# Patient Record
Sex: Female | Born: 1990
Health system: Southern US, Community
[De-identification: ages and names within clinical notes are randomized; demographics above are authoritative.]

## PROBLEM LIST (undated history)

## (undated) DIAGNOSIS — R011 Cardiac murmur, unspecified: Secondary | ICD-10-CM

## (undated) DIAGNOSIS — D649 Anemia, unspecified: Secondary | ICD-10-CM

## (undated) DIAGNOSIS — F53 Postpartum depression: Secondary | ICD-10-CM

## (undated) DIAGNOSIS — F419 Anxiety disorder, unspecified: Secondary | ICD-10-CM

## (undated) DIAGNOSIS — K219 Gastro-esophageal reflux disease without esophagitis: Secondary | ICD-10-CM

## (undated) DIAGNOSIS — I10 Essential (primary) hypertension: Secondary | ICD-10-CM

## (undated) HISTORY — DX: Cardiac murmur, unspecified: R01.1

## (undated) HISTORY — PX: OTHER SURGICAL HISTORY: SHX169

## (undated) HISTORY — DX: Postpartum depression: F53.0

## (undated) HISTORY — DX: Anxiety disorder, unspecified: F41.9

## (undated) HISTORY — PX: WISDOM TOOTH EXTRACTION: SHX21

---

## 2008-08-23 ENCOUNTER — Inpatient Hospital Stay (HOSPITAL_COMMUNITY): Admission: AD | Admit: 2008-08-23 | Discharge: 2008-08-23 | Payer: Self-pay | Admitting: Obstetrics

## 2008-11-19 ENCOUNTER — Emergency Department (HOSPITAL_COMMUNITY): Admission: EM | Admit: 2008-11-19 | Discharge: 2008-11-19 | Payer: Self-pay | Admitting: Emergency Medicine

## 2011-06-14 LAB — URINE CULTURE: Colony Count: >=100000

## 2011-06-14 LAB — GC/CHLAMYDIA PROBE AMP, URINE: GC Probe Amp, Urine: NEGATIVE

## 2014-03-23 ENCOUNTER — Encounter: Payer: Self-pay | Admitting: Family

## 2014-03-23 ENCOUNTER — Encounter (INDEPENDENT_AMBULATORY_CARE_PROVIDER_SITE_OTHER): Payer: Self-pay

## 2014-03-23 ENCOUNTER — Ambulatory Visit (INDEPENDENT_AMBULATORY_CARE_PROVIDER_SITE_OTHER): Payer: 59 | Admitting: Family

## 2014-03-23 VITALS — BP 124/78 | HR 55 | Temp 99.2°F | Ht 66.0 in | Wt 235.0 lb

## 2014-03-23 DIAGNOSIS — Z0000 Encounter for general adult medical examination without abnormal findings: Secondary | ICD-10-CM

## 2014-03-23 NOTE — Progress Notes (Signed)
   Subjective:    Patient ID: Marissa Blake, female    DOB: 12/08/1990, 23 y.o.   MRN: [id number]  HPI Pt presents to office to establish care. Pt states she saw her gyn two weeks ago for pap and had blood work drawn. States they drew  "cholesterol, kidney, and diabetes".  Pt denies any pain, SOB, or edema.    Review of Systems  Constitutional: Negative.   HENT: Negative.   Eyes: Negative.   Respiratory: Negative.  Negative for shortness of breath.   Cardiovascular: Negative.  Negative for palpitations.  Gastrointestinal: Negative.   Endocrine: Negative.   Genitourinary: Negative.   Musculoskeletal: Positive for joint swelling.  Neurological: Negative.  Negative for headaches.  Hematological: Negative.   Psychiatric/Behavioral: Negative.   All other systems reviewed and are negative.      Objective:   Physical Exam  Vitals reviewed. Constitutional: She is oriented to person, place, and time. She appears well-developed and well-nourished. No distress.  HENT:  Head: Normocephalic and atraumatic.  Right Ear: External ear normal.  Mouth/Throat: Oropharynx is clear and moist.  Eyes: Pupils are equal, round, and reactive to light.  Neck: Normal range of motion. Neck supple. No thyromegaly present.  Cardiovascular: Normal rate, regular rhythm, normal heart sounds and intact distal pulses.   No murmur heard. Pulmonary/Chest: Effort normal and breath sounds normal. No respiratory distress. She has no wheezes.  Abdominal: Soft. Bowel sounds are normal. She exhibits no distension. There is no tenderness.  Musculoskeletal: Normal range of motion. She exhibits no edema and no tenderness.  Neurological: She is alert and oriented to person, place, and time. She has normal reflexes. No cranial nerve deficit.  Skin: Skin is warm and dry.  Psychiatric: She has a normal mood and affect. Her behavior is normal. Judgment and thought content normal.     BP 124/78  Pulse 55  Temp(Src) 99.2 F  (37.3 C) (Oral)  Ht 5\' 6"  (1.676 m)  Wt 235 lb (106.595 kg)  BMI 37.95 kg/m2      Assessment & Plan:  1. Physical exam   Continue all meds Pt had labs drawn at gyn office- Pt to bring us a copy of lab work Health Maintenance reviewed Diet and exercise encouraged RTO 1 year   Jannifer Rodneyhristy Hawks, FNP

## 2014-03-23 NOTE — Patient Instructions (Addendum)
Health Maintenance, Female A healthy lifestyle and preventative care can promote health and wellness.  Maintain regular health, dental, and eye exams.  Eat a healthy diet. Foods like vegetables, fruits, whole grains, low-fat dairy products, and lean protein foods contain the nutrients you need without too many calories. Decrease your intake of foods high in solid fats, added sugars, and salt. Get information about a proper diet from your caregiver, if necessary.  Regular physical exercise is one of the most important things you can do for your health. Most adults should get at least 150 minutes of moderate-intensity exercise (any activity that increases your heart rate and causes you to sweat) each week. In addition, most adults need muscle-strengthening exercises on 2 or more days a week.   Maintain a healthy weight. The body mass index (BMI) is a screening tool to identify possible weight problems. It provides an estimate of body fat based on height and weight. Your caregiver can help determine your BMI, and can help you achieve or maintain a healthy weight. For adults 20 years and older:  A BMI below 18.5 is considered underweight.  A BMI of 18.5 to 24.9 is normal.  A BMI of 25 to 29.9 is considered overweight.  A BMI of 30 and above is considered obese.  Maintain normal blood lipids and cholesterol by exercising and minimizing your intake of saturated fat. Eat a balanced diet with plenty of fruits and vegetables. Blood tests for lipids and cholesterol should begin at age 41 and be repeated every 5 years. If your lipid or cholesterol levels are high, you are over 50, or you are a high risk for heart disease, you may need your cholesterol levels checked more frequently.Ongoing high lipid and cholesterol levels should be treated with medicines if diet and exercise are not effective.  If you smoke, find out from your caregiver how to quit. If you do not use tobacco, do not start.  Lung  cancer screening is recommended for adults aged 66-80 years who are at high risk for developing lung cancer because of a history of smoking. Yearly low-dose computed tomography (CT) is recommended for people who have at least a 30-pack-year history of smoking and are a current smoker or have quit within the past 15 years. A pack year of smoking is smoking an average of 1 pack of cigarettes a day for 1 year (for example: 1 pack a day for 30 years or 2 packs a day for 15 years). Yearly screening should continue until the smoker has stopped smoking for at least 15 years. Yearly screening should also be stopped for people who develop a health problem that would prevent them from having lung cancer treatment.  If you are pregnant, do not drink alcohol. If you are breastfeeding, be very cautious about drinking alcohol. If you are not pregnant and choose to drink alcohol, do not exceed 1 drink per day. One drink is considered to be 12 ounces (355 mL) of beer, 5 ounces (148 mL) of wine, or 1.5 ounces (44 mL) of liquor.  Avoid use of street drugs. Do not share needles with anyone. Ask for help if you need support or instructions about stopping the use of drugs.  High blood pressure causes heart disease and increases the risk of stroke. Blood pressure should be checked at least every 1 to 2 years. Ongoing high blood pressure should be treated with medicines, if weight loss and exercise are not effective.  If you are 55 to 25  years old, ask your caregiver if you should take aspirin to prevent strokes.  Diabetes screening involves taking a blood sample to check your fasting blood sugar level. This should be done once every 3 years, after age 45, if you are within normal weight and without risk factors for diabetes. Testing should be considered at a younger age or be carried out more frequently if you are overweight and have at least 1 risk factor for diabetes.  Breast cancer screening is essential preventative care  for women. You should practice "breast self-awareness." This means understanding the normal appearance and feel of your breasts and may include breast self-examination. Any changes detected, no matter how small, should be reported to a caregiver. Women in their 20s and 30s should have a clinical breast exam (CBE) by a caregiver as part of a regular health exam every 1 to 3 years. After age 40, women should have a CBE every year. Starting at age 40, women should consider having a mammogram (breast X-ray) every year. Women who have a family history of breast cancer should talk to their caregiver about genetic screening. Women at a high risk of breast cancer should talk to their caregiver about having an MRI and a mammogram every year.  Breast cancer gene (BRCA)-related cancer risk assessment is recommended for women who have family members with BRCA-related cancers. BRCA-related cancers include breast, ovarian, tubal, and peritoneal cancers. Having family members with these cancers may be associated with an increased risk for harmful changes (mutations) in the breast cancer genes BRCA1 and BRCA2. Results of the assessment will determine the need for genetic counseling and BRCA1 and BRCA2 testing.  The Pap test is a screening test for cervical cancer. Women should have a Pap test starting at age 21. Between ages 21 and 29, Pap tests should be repeated every 2 years. Beginning at age 30, you should have a Pap test every 3 years as long as the past 3 Pap tests have been normal. If you had a hysterectomy for a problem that was not cancer or a condition that could lead to cancer, then you no longer need Pap tests. If you are between ages 65 and 70, and you have had normal Pap tests going back 10 years, you no longer need Pap tests. If you have had past treatment for cervical cancer or a condition that could lead to cancer, you need Pap tests and screening for cancer for at least 20 years after your treatment. If Pap  tests have been discontinued, risk factors (such as a new sexual partner) need to be reassessed to determine if screening should be resumed. Some women have medical problems that increase the chance of getting cervical cancer. In these cases, your caregiver may recommend more frequent screening and Pap tests.  The human papillomavirus (HPV) test is an additional test that may be used for cervical cancer screening. The HPV test looks for the virus that can cause the cell changes on the cervix. The cells collected during the Pap test can be tested for HPV. The HPV test could be used to screen women aged 30 years and older, and should be used in women of any age who have unclear Pap test results. After the age of 30, women should have HPV testing at the same frequency as a Pap test.  Colorectal cancer can be detected and often prevented. Most routine colorectal cancer screening begins at the age of 50 and continues through age 75. However, your caregiver may   recommend screening at an earlier age if you have risk factors for colon cancer. On a yearly basis, your caregiver may provide home test kits to check for hidden blood in the stool. Use of a small camera at the end of a tube, to directly examine the colon (sigmoidoscopy or colonoscopy), can detect the earliest forms of colorectal cancer. Talk to your caregiver about this at age 110, when routine screening begins. Direct examination of the colon should be repeated every 5 to 10 years through age 60, unless early forms of pre-cancerous polyps or small growths are found.  Hepatitis C blood testing is recommended for all people born from [birth year] through 1967 and any individual with known risks for hepatitis C.  Practice safe sex. Use condoms and avoid high-risk sexual practices to reduce the spread of sexually transmitted infections (STIs). Sexually active women aged 22 and younger should be checked for Chlamydia, which is a common sexually transmitted infection.  Older women with new or multiple partners should also be tested for Chlamydia. Testing for other STIs is recommended if you are sexually active and at increased risk.  Osteoporosis is a disease in which the bones lose minerals and strength with aging. This can result in serious bone fractures. The risk of osteoporosis can be identified using a bone density scan. Women ages 10 and over and women at risk for fractures or osteoporosis should discuss screening with their caregivers. Ask your caregiver whether you should be taking a calcium supplement or vitamin D to reduce the rate of osteoporosis.  Menopause can be associated with physical symptoms and risks. Hormone replacement therapy is available to decrease symptoms and risks. You should talk to your caregiver about whether hormone replacement therapy is right for you.  Use sunscreen. Apply sunscreen liberally and repeatedly throughout the day. You should seek shade when your shadow is shorter than you. Protect yourself by wearing long sleeves, pants, a wide-brimmed hat, and sunglasses year round, whenever you are outdoors.  Notify your caregiver of new moles or changes in moles, especially if there is a change in shape or color. Also notify your caregiver if a mole is larger than the size of a pencil eraser.  Stay current with your immunizations. Document Released: 03/11/2011 Document Revised: 12/21/2012 Document Reviewed: 07/28/2013 [facility] Patient Information 2015 [place], [place]. This information is not intended to replace advice given to you by your health care provider. Make sure you discuss any questions you have with your health care provider. Calorie Counting for Weight Loss Calories are energy you get from the things you eat and drink. Your body uses this energy to keep you going throughout the day. The number of calories you eat affects your weight. When you eat more calories than your body needs, your body stores the extra calories as  fat. When you eat fewer calories than your body needs, your body burns fat to get the energy it needs. Calorie counting means keeping track of how many calories you eat and drink each day. If you make sure to eat fewer calories than your body needs, you should lose weight. In order for calorie counting to work, you will need to eat the number of calories that are right for you in a day to lose a healthy amount of weight per week. A healthy amount of weight to lose per week is usually 1-2 lb (0.5-0.9 kg). A dietitian can determine how many calories you need in a day and give you suggestions on how to  reach your calorie goal.  WHAT IS MY MY PLAN? My goal is to have __________ calories per day.  If I have this many calories per day, I should lose around __________ pounds per week. WHAT DO I NEED TO KNOW ABOUT CALORIE COUNTING? In order to meet your daily calorie goal, you will need to:  Find out how many calories are in each food you would like to eat. Try to do this before you eat.  Decide how much of the food you can eat.  Write down what you ate and how many calories it had. Doing this is called keeping a food log. WHERE DO I FIND CALORIE INFORMATION? The number of calories in a food can be found on a Nutrition Facts label. Note that all the information on a label is based on a specific serving of the food. If a food does not have a Nutrition Facts label, try to look up the calories online or ask your dietitian for help. HOW DO I DECIDE HOW MUCH TO EAT? To decide how much of the food you can eat, you will need to consider both the number of calories in one serving and the size of one serving. This information can be found on the Nutrition Facts label. If a food does not have a Nutrition Facts label, look up the information online or ask your dietitian for help. Remember that calories are listed per serving. If you choose to have more than one serving of a food, you will have to multiply the calories  per serving by the amount of servings you plan to eat. For example, the label on a package of bread might say that a serving size is 1 slice and that there are 90 calories in a serving. If you eat 1 slice, you will have eaten 90 calories. If you eat 2 slices, you will have eaten 180 calories. HOW DO I KEEP A FOOD LOG? After each meal, record the following information in your food log:  What you ate.  How much of it you ate.  How many calories it had.  Then, add up your calories. Keep your food log near you, such as in a small notebook in your pocket. Another option is to use a mobile app or website. Some programs will calculate calories for you and show you how many calories you have left each time you add an item to the log. WHAT ARE SOME CALORIE COUNTING TIPS?  Use your calories on foods and drinks that will fill you up and not leave you hungry. Some examples of this include foods like nuts and nut butters, vegetables, lean proteins, and high-fiber foods (more than 5 g fiber per serving).  Eat nutritious foods and avoid empty calories. Empty calories are calories you get from foods or beverages that do not have many nutrients, such as candy and soda. It is better to have a nutritious high-calorie food (such as an avocado) than a food with few nutrients (such as a bag of chips).  Know how many calories are in the foods you eat most often. This way, you do not have to look up how many calories they have each time you eat them.  Look out for foods that may seem like low-calorie foods but are really high-calorie foods, such as baked goods, soda, and fat-free candy.  Pay attention to calories in drinks. Drinks such as sodas, specialty coffee drinks, alcohol, and juices have a lot of calories yet do not fill  you up. Choose low-calorie drinks like water and diet drinks.  Focus your calorie counting efforts on higher calorie items. Logging the calories in a garden salad that contains only  vegetables is less important than calculating the calories in a milk shake.  Find a way of tracking calories that works for you. Get creative. Most people who are successful find ways to keep track of how much they eat in a day, even if they do not count every calorie. WHAT ARE SOME PORTION CONTROL TIPS?  Know how many calories are in a serving. This will help you know how many servings of a certain food you can have.  Use a measuring cup to measure serving sizes. This is helpful when you start out. With time, you will be able to estimate serving sizes for some foods.  Take some time to put servings of different foods on your favorite plates, bowls, and cups so you know what a serving looks like.  Try not to eat straight from a bag or box. Doing this can lead to overeating. Put the amount you would like to eat in a cup or on a plate to make sure you are eating the right portion.  Use smaller plates, glasses, and bowls to prevent overeating. This is a quick and easy way to practice portion control. If your plate is smaller, less food can fit on it.  Try not to multitask while eating, such as watching TV or using your computer. If it is time to eat, sit down at a table and enjoy your food. Doing this will help you to start recognizing when you are full. It will also make you more aware of what and how much you are eating. HOW CAN I CALORIE COUNT WHEN EATING OUT?  Ask for smaller portion sizes or child-sized portions.  Consider sharing an [place] and sides instead of getting your own [place].  If you get your own [place]io Me, eat only half. Ask for a box at the beginning of your meal and put the rest of your entre in it so you are not tempted to eat it.  Look for the calories on the menu. If calories are listed, choose the lower calorie options.  Choose dishes that include vegetables, fruits, whole grains, low-fat dairy products, and lean protein. Focusing on smart food choices from each of the 5  food groups can help you stay on track at restaurants.  Choose items that are boiled, broiled, grilled, or steamed.  Choose water, milk, unsweetened iced tea, or other drinks without added sugars. If you want an alcoholic beverage, choose a lower calorie option. For example, a regular margarita can have up to 700 calories and a glass of wine has around 150.  Stay away from items that are buttered, battered, fried, or served with cream sauce. Items labeled "crispy" are usually fried, unless stated otherwise.  Ask for dressings, sauces, and syrups on the side. These are usually very high in calories, so do not eat much of them.  Watch out for salads. Many people think salads are a healthy option, but this is often not the case. Many salads come with bacon, fried chicken, lots of cheese, fried chips, and dressing. All of these items have a lot of calories. If you want a salad, choose a garden salad and ask for grilled meats or steak. Ask for the dressing on the side, or ask for olive oil and vinegar or lemon to use as dressing.  Estimate how many  servings of a food you are given. For example, a serving of cooked rice is 1/2 cup or about the size of half a tennis ball or one cupcake wrapper. Knowing serving sizes will help you be aware of how much food you are eating at restaurants. The list below tells you how big or small some common portion sizes are based on everyday objects.  1 oz--4 stacked dice.  3 oz--1 deck of cards.  1 tsp--1 dice.  1 Tbsp--1/2 a Ping-Pong ball.  2 Tbsp--1 Ping-Pong ball.  1/2 cup--1 tennis ball or 1 cupcake wrapper.  1 cup--1 baseball. Document Released: 08/26/2005 Document Revised: 08/31/2013 Document Reviewed: 07/01/2013 [facility] Patient Information 2015 [place], [place]. This information is not intended to replace advice given to you by your health care provider. Make sure you discuss any questions you have with your health care provider.

## 2014-12-23 ENCOUNTER — Ambulatory Visit (INDEPENDENT_AMBULATORY_CARE_PROVIDER_SITE_OTHER): Payer: 59 | Admitting: Physician Assistant

## 2014-12-23 ENCOUNTER — Encounter: Payer: Self-pay | Admitting: Physician Assistant

## 2014-12-23 VITALS — BP 125/90 | HR 56 | Temp 98.1°F | Ht 66.0 in | Wt 230.0 lb

## 2014-12-23 DIAGNOSIS — R1011 Right upper quadrant pain: Secondary | ICD-10-CM

## 2014-12-23 DIAGNOSIS — Z8379 Family history of other diseases of the digestive system: Secondary | ICD-10-CM | POA: Diagnosis not present

## 2014-12-23 NOTE — Progress Notes (Signed)
   Subjective:    Patient ID: Maurilio LovelyAmber L Shaw, female    DOB: 10/17/1990, 24 y.o.   MRN: [id number]  HPI 24 y/o female presents w/ c/o intermittent nausea, worse with greasy, spicy foods, pain in RUQ radiating to shoulder x 5 years which has worsened and increased in frequency. Last episode was 2 weeks ago. Strong family hx of gallbladder problems.     Review of Systems  Constitutional: Negative.   Gastrointestinal: Positive for nausea (intermittent), abdominal pain (right side radiating to right shoulder ), diarrhea and constipation.  Genitourinary: Negative.   All other systems reviewed and are negative.      Objective:   Physical Exam  Constitutional: She is oriented to person, place, and time. She appears well-developed. No distress.  Obese   Abdominal: Soft. Bowel sounds are normal. She exhibits no distension and no mass. There is tenderness (mild in RUQ, negative murphys sign ). There is no rebound and no guarding.  Negative CVA TTP  Neurological: She is alert and oriented to person, place, and time. She exhibits abnormal muscle tone.  Skin: She is not diaphoretic.  Psychiatric: She has a normal mood and affect. Her behavior is normal. Judgment and thought content normal.  Nursing note and vitals reviewed.         Assessment & Plan:  1. Colicky RUQ abdominal pain 2. Family history of gallbladder disease  - Ambulatory referral to Gastroenterology

## 2015-01-03 ENCOUNTER — Encounter (INDEPENDENT_AMBULATORY_CARE_PROVIDER_SITE_OTHER): Payer: Self-pay | Admitting: *Deleted

## 2015-01-31 ENCOUNTER — Encounter (INDEPENDENT_AMBULATORY_CARE_PROVIDER_SITE_OTHER): Payer: Self-pay | Admitting: *Deleted

## 2015-01-31 ENCOUNTER — Encounter (INDEPENDENT_AMBULATORY_CARE_PROVIDER_SITE_OTHER): Payer: Self-pay | Admitting: Internal Medicine

## 2015-01-31 ENCOUNTER — Ambulatory Visit (INDEPENDENT_AMBULATORY_CARE_PROVIDER_SITE_OTHER): Payer: 59 | Admitting: Internal Medicine

## 2015-01-31 VITALS — BP 108/80 | HR 72 | Temp 98.2°F | Ht 66.0 in | Wt 233.2 lb

## 2015-01-31 DIAGNOSIS — R1011 Right upper quadrant pain: Secondary | ICD-10-CM

## 2015-01-31 LAB — HEPATIC FUNCTION PANEL
ALBUMIN: 3.6 g/dL (ref 3.5–5.2)
ALK PHOS: 75 U/L (ref 39–117)
ALT: 10 U/L (ref 0–35)
AST: 12 U/L (ref 0–37)
BILIRUBIN DIRECT: 0.1 mg/dL (ref 0.0–0.3)
BILIRUBIN INDIRECT: 0.2 mg/dL (ref 0.2–1.2)
TOTAL PROTEIN: 7.4 g/dL (ref 6.0–8.3)
Total Bilirubin: 0.3 mg/dL (ref 0.2–1.2)

## 2015-01-31 NOTE — Patient Instructions (Signed)
US abdomen and Hepatic function. If normal will get HIDA scan

## 2015-01-31 NOTE — Progress Notes (Signed)
   Subjective:    Patient ID: Marissa LovelyAmber L Shaw, female    DOB: 02/24/1991, 24 y.o.   MRN: [id number]  HPI [facility] RUQ. She tells me the pain is not constant. Usually occurs at night or after she eats.  Sometimes it goes to her rt shoulder and upper left quadrant. Symptoms for a couple of years.  Symptoms worse in the past year. Aggravated by greasy foods. She is trying to avoid them. Appetite is good. No weight loss. She usually has a BM  X 1 a day or every other day.,  Family hx of GB disease in her family. (Sister of this year removed)    Review of Systems Past Medical History  Diagnosis Date  . Heart murmur     Past Surgical History  Procedure Laterality Date  . None      Allergies  Allergen Reactions  . Benzoyl Peroxide Other (See Comments)    redness  . Latex Other (See Comments)    Rash/irritation    Current Outpatient Prescriptions on File Prior to Visit  Medication Sig Dispense Refill  . JUNEL 1/20 1-20 MG-MCG tablet      No current facility-administered medications on file prior to visit.          Single, No children. Works at [employer]. Student at [employer]: Cos[employer]ology Objective:   Physical Exam Blood pressure 108/80, pulse 72, temperature 98.2 F (36.8 C), height 5\' 6"  (1.676 m), weight 233 lb 3.2 oz (105.779 kg).   Alert and oriented. Skin warm and dry. Oral mucosa is moist.   . Sclera anicteric, conjunctivae is pink. Thyroid not enlarged. No cervical lymphadenopathy. Lungs clear. Heart regular rate and rhythm.  Abdomen is soft. Bowel sounds are positive. No hepatomegaly. No abdominal masses felt. No tenderness.  No edema to lower extremities.       Assessment & Plan:  RUQ pain. GB disease needs to be ruled out. Hepatic function. US abdomen. If normal. HIDA scan.

## 2015-02-01 ENCOUNTER — Telehealth (INDEPENDENT_AMBULATORY_CARE_PROVIDER_SITE_OTHER): Payer: Self-pay | Admitting: Internal Medicine

## 2015-02-01 NOTE — Telephone Encounter (Signed)
Results given to patient. She is scheduled for an US abd. Tomorrow.

## 2015-02-02 ENCOUNTER — Ambulatory Visit (HOSPITAL_COMMUNITY)
Admission: RE | Admit: 2015-02-02 | Discharge: 2015-02-02 | Disposition: A | Discharge status: Home / Self Care | Payer: 59 | Source: Ambulatory Visit | Attending: Internal Medicine | Admitting: Internal Medicine

## 2015-02-02 DIAGNOSIS — R1011 Right upper quadrant pain: Secondary | ICD-10-CM | POA: Diagnosis present

## 2015-02-03 ENCOUNTER — Telehealth (INDEPENDENT_AMBULATORY_CARE_PROVIDER_SITE_OTHER): Payer: Self-pay | Admitting: Internal Medicine

## 2015-02-03 DIAGNOSIS — R1011 Right upper quadrant pain: Secondary | ICD-10-CM

## 2015-02-03 NOTE — Telephone Encounter (Signed)
Continues to have pain ruq after eating. Hepatic function is normal. Am going to refer her to Dr. Lovell SheehanJenkins to see if she needs a cholecystectomy

## 2015-03-02 ENCOUNTER — Encounter (HOSPITAL_COMMUNITY): Payer: Self-pay

## 2015-03-02 ENCOUNTER — Emergency Department (HOSPITAL_COMMUNITY): Payer: Worker's Compensation

## 2015-03-02 ENCOUNTER — Emergency Department (HOSPITAL_COMMUNITY)
Admission: EM | Admit: 2015-03-02 | Discharge: 2015-03-03 | Disposition: A | Discharge status: Home / Self Care | Payer: Worker's Compensation | Attending: Emergency Medicine | Admitting: Emergency Medicine

## 2015-03-02 DIAGNOSIS — X58XXXA Exposure to other specified factors, initial encounter: Secondary | ICD-10-CM | POA: Insufficient documentation

## 2015-03-02 DIAGNOSIS — S8392XA Sprain of unspecified site of left knee, initial encounter: Secondary | ICD-10-CM | POA: Diagnosis not present

## 2015-03-02 DIAGNOSIS — S8992XA Unspecified injury of left lower leg, initial encounter: Secondary | ICD-10-CM | POA: Diagnosis present

## 2015-03-02 DIAGNOSIS — Z91040 Latex allergy status: Secondary | ICD-10-CM | POA: Diagnosis not present

## 2015-03-02 DIAGNOSIS — Y9389 Activity, other specified: Secondary | ICD-10-CM | POA: Insufficient documentation

## 2015-03-02 DIAGNOSIS — R011 Cardiac murmur, unspecified: Secondary | ICD-10-CM | POA: Insufficient documentation

## 2015-03-02 DIAGNOSIS — Y929 Unspecified place or not applicable: Secondary | ICD-10-CM | POA: Diagnosis not present

## 2015-03-02 DIAGNOSIS — Y998 Other external cause status: Secondary | ICD-10-CM | POA: Insufficient documentation

## 2015-03-02 NOTE — ED Notes (Signed)
Pt states she was working at [employer] when she feels she twisted her left knee and felt a pop at that time. Pt states since then it has been more painful.  Pt ambulatory without diff.

## 2015-03-03 ENCOUNTER — Other Ambulatory Visit: Payer: Self-pay | Admitting: Surgery

## 2015-03-03 ENCOUNTER — Ambulatory Visit: Payer: 59 | Admitting: Surgery

## 2015-03-03 DIAGNOSIS — R1011 Right upper quadrant pain: Secondary | ICD-10-CM

## 2015-03-03 DIAGNOSIS — R109 Unspecified abdominal pain: Secondary | ICD-10-CM

## 2015-03-03 DIAGNOSIS — S8392XA Sprain of unspecified site of left knee, initial encounter: Secondary | ICD-10-CM | POA: Diagnosis not present

## 2015-03-03 MED ORDER — IBUPROFEN 600 MG PO TABS: 600.0000 mg | ORAL_TABLET | Freq: Four times a day (QID) | ORAL | Status: DC

## 2015-03-03 MED ORDER — IBUPROFEN 800 MG PO TABS
800.0000 mg | ORAL_TABLET | Freq: Once | ORAL | Status: AC
  Administered 2015-03-03: 800 mg via ORAL
  Filled 2015-03-03: qty 1

## 2015-03-03 NOTE — Discharge Instructions (Signed)
Your x-ray is negative for fracture or dislocation or effusion. Please use the knee immobilizer for the next 7 days. You do not have to sleep in this device. Please use ibuprofen with breakfast, lunch, dinner, and at bedtime. Please see Dr. Romeo Apple for additional evaluation if not improving. Knee Sprain A knee sprain is a tear in one of the strong, fibrous tissues that connect the bones (ligaments) in your knee. The severity of the sprain depends on how much of the ligament is torn. The tear can be either partial or complete. CAUSES  Often, sprains are a result of a fall or injury. The force of the impact causes the fibers of your ligament to stretch too much. This excess tension causes the fibers of your ligament to tear. SIGNS AND SYMPTOMS  You may have some loss of motion in your knee. Other symptoms include:  Bruising.  Pain in the knee area.  Tenderness of the knee to the touch.  Swelling. DIAGNOSIS  To diagnose a knee sprain, your health care provider will physically examine your knee. Your health care provider may also suggest an X-ray exam of your knee to make sure no bones are broken. TREATMENT  If your ligament is only partially torn, treatment usually involves keeping the knee in a fixed position (immobilization) or bracing your knee for activities that require movement for several weeks. To do this, your health care provider will apply a bandage, cast, or splint to keep your knee from moving and to support your knee during movement until it heals. For a partially torn ligament, the healing process usually takes 4-6 weeks. If your ligament is completely torn, depending on which ligament it is, you may need surgery to reconnect the ligament to the bone or reconstruct it. After surgery, a cast or splint may be applied and will need to stay on your knee for 4-6 weeks while your ligament heals. HOME CARE INSTRUCTIONS  Keep your injured knee elevated to decrease swelling.  To ease pain  and swelling, apply ice to the injured area:  Put ice in a plastic bag.  Place a towel between your skin and the bag.  Leave the ice on for 20 minutes, 2-3 times a day.  Only take medicine for pain as directed by your health care provider.  Do not leave your knee unprotected until pain and stiffness go away (usually 4-6 weeks).  If you have a cast or splint, do not allow it to get wet. If you have been instructed not to remove it, cover it with a plastic bag when you shower or bathe. Do not swim.  Your health care provider may suggest exercises for you to do during your recovery to prevent or limit permanent weakness and stiffness. SEEK IMMEDIATE MEDICAL CARE IF:  Your cast or splint becomes damaged.  Your pain becomes worse.  You have significant pain, swelling, or numbness below the cast or splint. MAKE SURE YOU:  Understand these instructions.  Will watch your condition.  Will get help right away if you are not doing well or get worse. Document Released: 08/26/2005 Document Revised: 06/16/2013 Document Reviewed: 04/07/2013 [facility] Patient Information 2015 [place], [place]. This information is not intended to replace advice given to you by your health care provider. Make sure you discuss any questions you have with your health care provider.

## 2015-03-03 NOTE — ED Provider Notes (Signed)
CSN: [id number]     Arrival date & time 03/02/15  2307 History   First MD Initiated Contact with Patient 03/02/15 2343     Chief Complaint  Patient presents with  . Knee Injury     (Consider location/radiation/quality/duration/timing/severity/associated sxs/prior Treatment) HPI Comments: Patient states that on Tuesday, June 21 she was working with a heavy box, and going up some steps when she twisted her left knee and felt a pop. She had some pain at the time was able to work through it. On Wednesday she states the pain was really not that bad, but on Thursday, June 23, the patient states that she had more popping and a burning sensation in the knee. She did not have a fall, but the kneecap, becoming more and more painful. She's not had any previous operations or procedures involving the knee. No previous injury reported.  The history is provided by the patient.    Past Medical History  Diagnosis Date  . Heart murmur    Past Surgical History  Procedure Laterality Date  . None     Family History  Problem Relation Age of Onset  . Autism Sister   . Hypertension Father    History  Substance Use Topics  . Smoking status: Never Smoker   . Smokeless tobacco: Not on file  . Alcohol Use: Yes     Comment: social   OB History    No data available     Review of Systems  Constitutional: Negative for activity change.       All ROS Neg except as noted in HPI  HENT: Negative for nosebleeds.   Eyes: Negative for photophobia and discharge.  Respiratory: Negative for cough, shortness of breath and wheezing.   Cardiovascular: Negative for chest pain and palpitations.  Gastrointestinal: Negative for abdominal pain and blood in stool.  Genitourinary: Negative for dysuria, frequency and hematuria.  Musculoskeletal: Negative for back pain, arthralgias and neck pain.  Skin: Negative.   Neurological: Negative for dizziness, seizures and speech difficulty.  Psychiatric/Behavioral: Negative  for hallucinations and confusion.      Allergies  Benzoyl peroxide and Latex  Home Medications   Prior to Admission medications   Medication Sig Start Date End Date Taking? Authorizing Provider  JUNEL 1/20 1-20 MG-MCG tablet  03/10/14  Yes Historical Provider, MD   BP 129/90 mmHg  Pulse 89  Temp(Src) 99.4 F (37.4 C) (Oral)  Resp 16  Ht 5\' 6"  (1.676 m)  Wt 232 lb (105.235 kg)  BMI 37.46 kg/m2  SpO2 99%  LMP 01/31/2015 Physical Exam  Constitutional: She is oriented to person, place, and time. She appears well-developed and well-nourished.  Non-toxic appearance.  HENT:  Head: Normocephalic.  Right Ear: Tympanic membrane and external ear normal.  Left Ear: Tympanic membrane and external ear normal.  Eyes: EOM and lids are normal. Pupils are equal, round, and reactive to light.  Neck: Normal range of motion. Neck supple. Carotid bruit is not present.  Cardiovascular: Normal rate, regular rhythm, normal heart sounds, intact distal pulses and normal pulses.   Pulmonary/Chest: Breath sounds normal. No respiratory distress.  Abdominal: Soft. Bowel sounds are normal. There is no tenderness. There is no guarding.  Musculoskeletal: Normal range of motion.  There is full range of motion of the right hip and knee. There is pain with thumb extension and somewhat flexion. There is no effusion of the joint. There is no posterior mass appreciated. Is no deformity of the tibia area. This will  range of motion of the right ankle.  Lymphadenopathy:       Head (right side): No submandibular adenopathy present.       Head (left side): No submandibular adenopathy present.    She has no cervical adenopathy.  Neurological: She is alert and oriented to person, place, and time. She has normal strength. No cranial nerve deficit or sensory deficit.  No motor or sensory deficits noted of the lower extremities.  Skin: Skin is warm and dry.  Psychiatric: She has a normal mood and affect. Her speech is  normal.  Nursing note and vitals reviewed.   ED Course  Procedures (including critical care time) Labs Review Labs Reviewed - No data to display  Imaging Review Dg Knee Complete 4 Views Left  03/03/2015   CLINICAL DATA:  Patient fell and twisted her left knee.  EXAM: LEFT KNEE - COMPLETE 4+ VIEW  COMPARISON:  None.  FINDINGS: There is no evidence of fracture, dislocation, or joint effusion. There is no evidence of arthropathy or other focal bone abnormality. Soft tissues are unremarkable.  IMPRESSION: Negative.   Electronically Signed   By: Annia Belt M.D.   On: 03/03/2015 00:19     EKG Interpretation None      MDM  Vital signs reviewed. X-ray of the left knee is negative for fracture, dislocation, or joint effusion. The examination favors a sprain of the left knee. The patient will be fitted with a knee immobilizer. Crutches were offered, but refused at this time. Patient will use ibuprofen 600 mg 4 times a day. Patient is to see Dr. Romeo Apple for additional orthopedic evaluation if not improving. Patient is in agreement with this discharge plan.    Final diagnoses:  None    *I have reviewed nursing notes, vital signs, and all appropriate lab and imaging results for this patient.[address] PA-C 03/03/15 0050  Azalia Bilis, MD 03/03/15 [phone]

## 2015-03-14 ENCOUNTER — Ambulatory Visit (HOSPITAL_COMMUNITY)
Admission: RE | Admit: 2015-03-14 | Discharge: 2015-03-14 | Disposition: A | Discharge status: Home / Self Care | Payer: 59 | Source: Ambulatory Visit | Attending: Surgery | Admitting: Surgery

## 2015-03-14 DIAGNOSIS — R1011 Right upper quadrant pain: Secondary | ICD-10-CM | POA: Diagnosis not present

## 2015-03-14 MED ORDER — SINCALIDE 5 MCG IJ SOLR
INTRAMUSCULAR | Status: AC
  Administered 2015-03-14: 2.11 ug
  Filled 2015-03-14: qty 10

## 2015-03-14 MED ORDER — TECHNETIUM TC 99M MEBROFENIN IV KIT
5.0000 | PACK | Freq: Once | INTRAVENOUS | Status: AC | PRN
  Administered 2015-03-14: 5 via INTRAVENOUS

## 2015-03-14 MED ORDER — STERILE WATER FOR INJECTION IJ SOLN
INTRAMUSCULAR | Status: AC
  Administered 2015-03-14: 10 mL
  Filled 2015-03-14: qty 20

## 2016-06-19 IMAGING — DX DG KNEE COMPLETE 4+V*L*
4 series · 4 of 4 positions shown · non-contrast
Comparison: None.

CLINICAL DATA: Patient fell and twisted her left knee.

EXAM:
LEFT KNEE - COMPLETE 4+ VIEW

[knee ap]
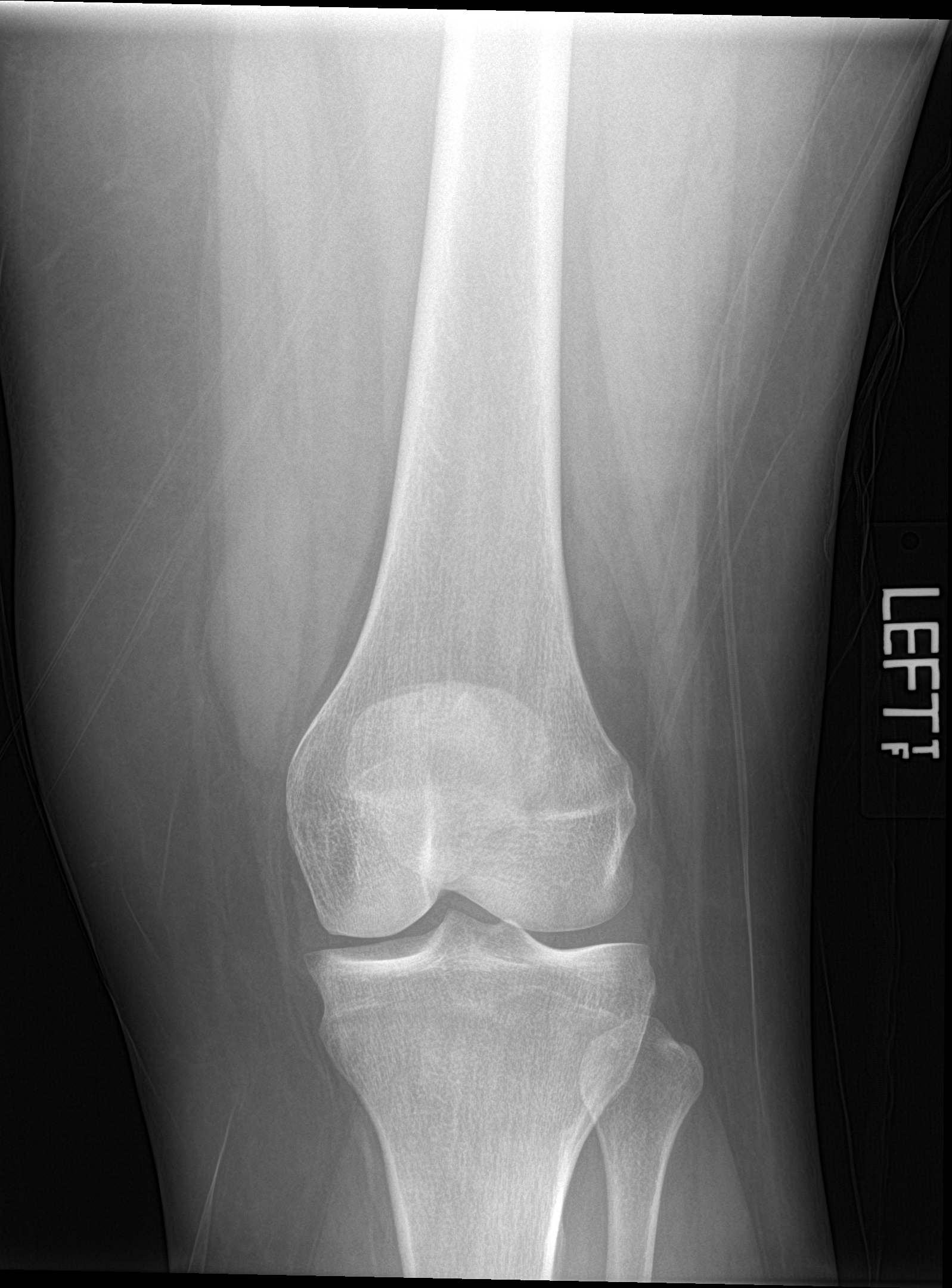

[knee obl (1 of 2)]
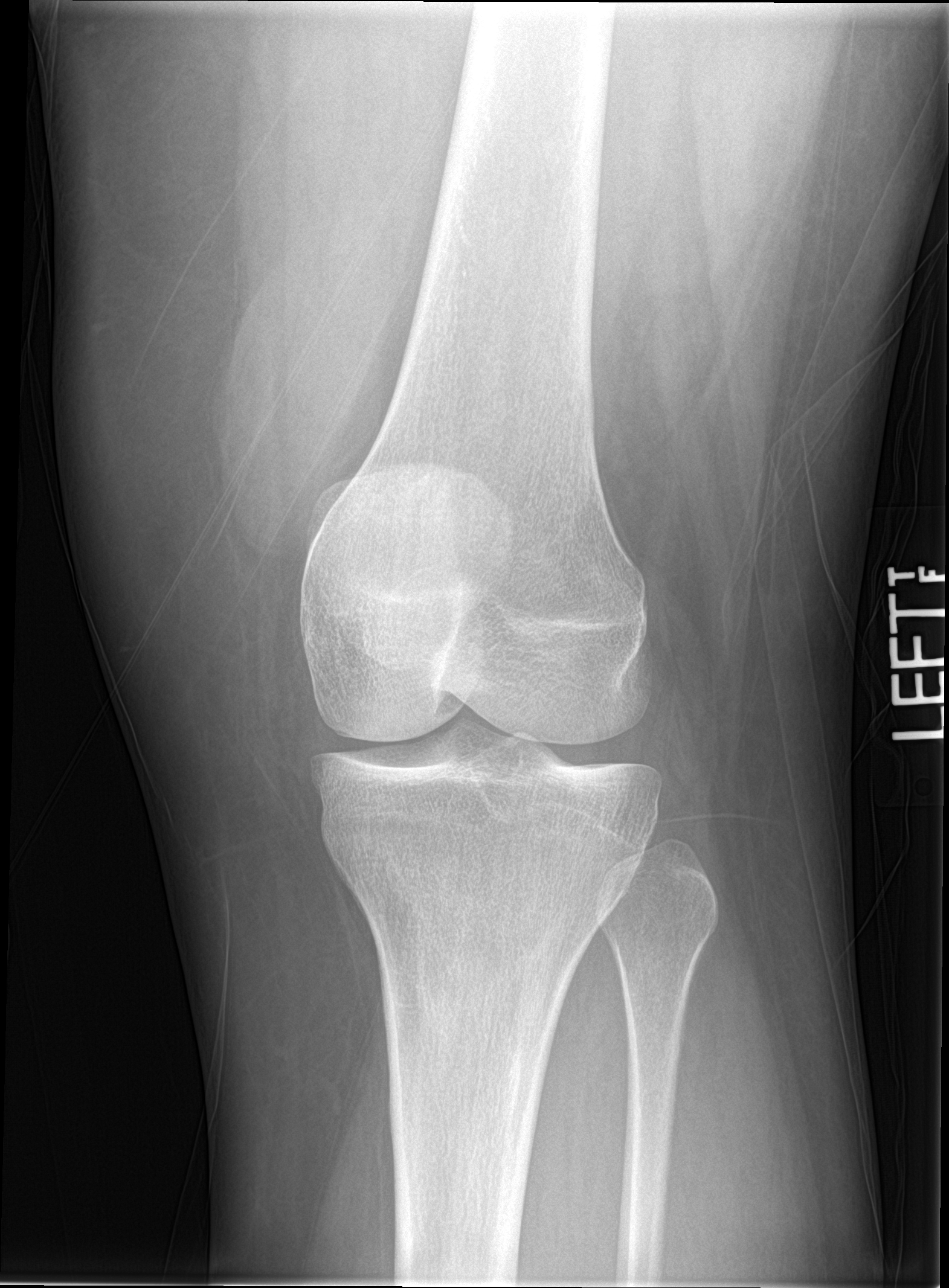

[knee obl (2 of 2)]
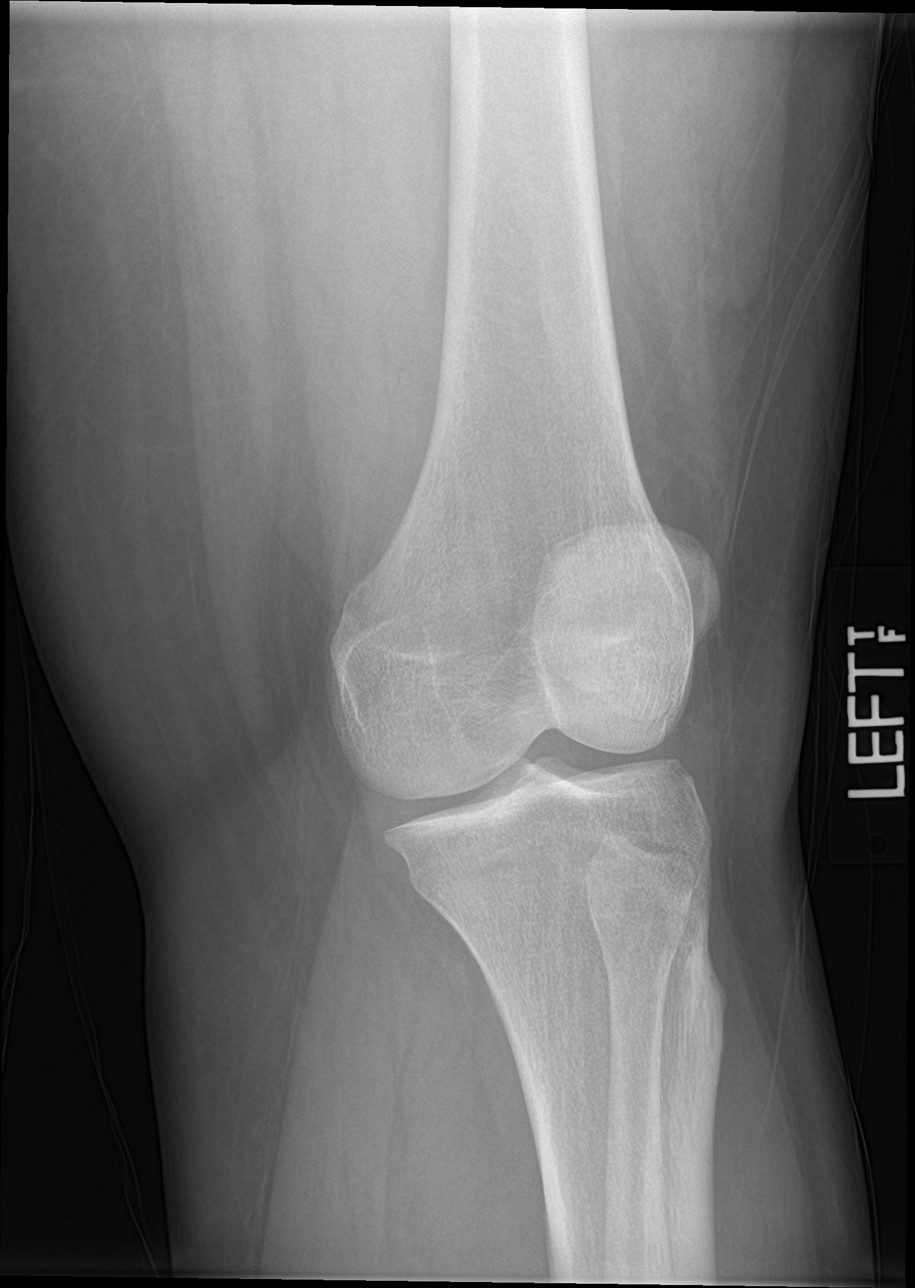

[knee lat]
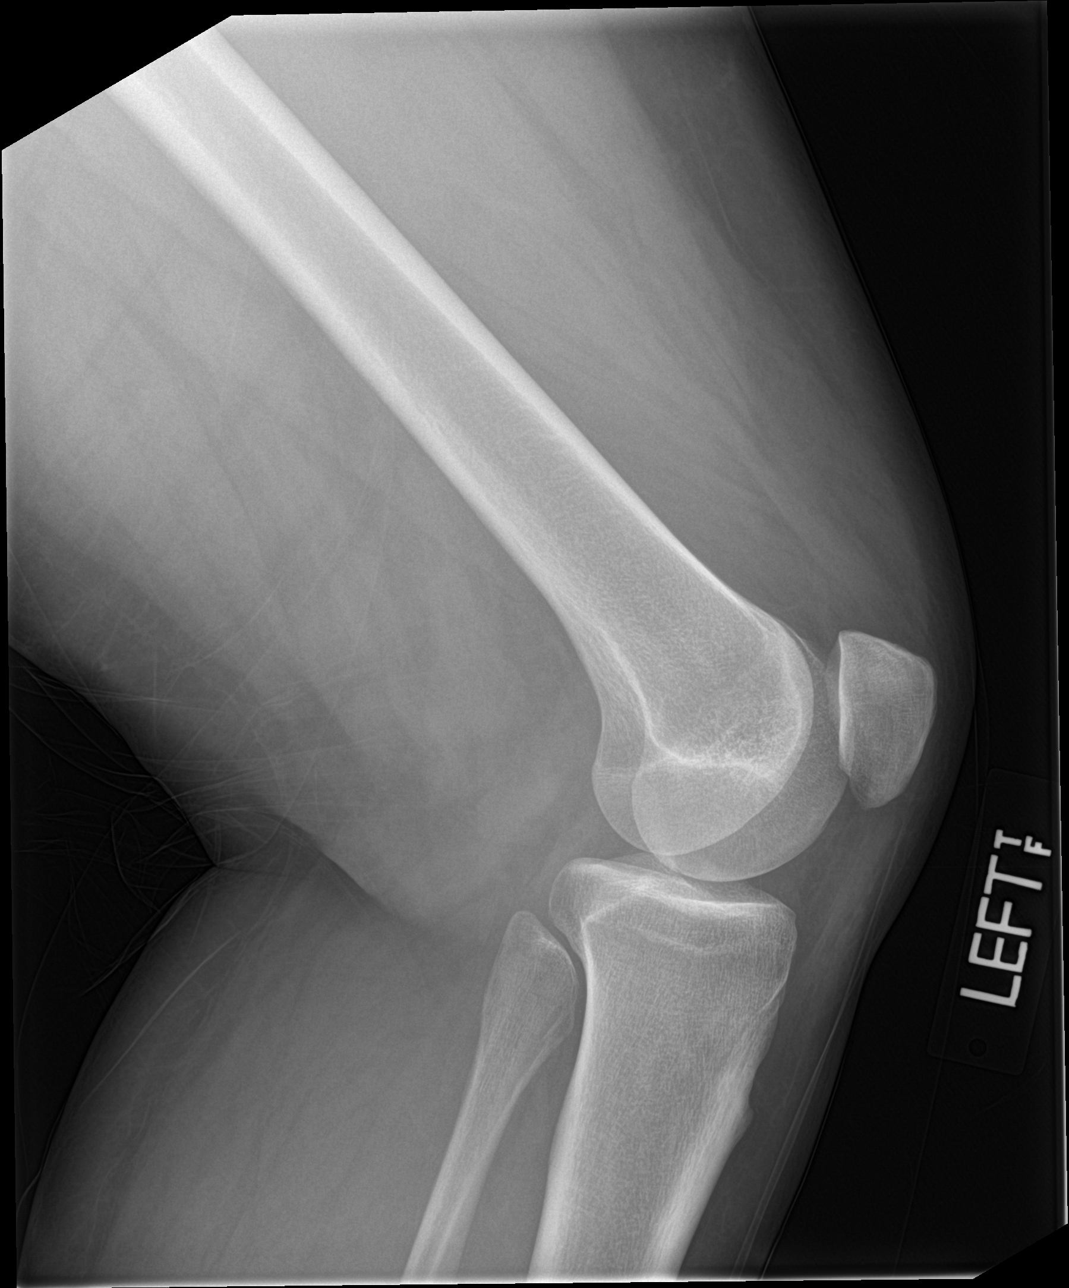

[4 of 4 positions shown; findings below may reference images not displayed]

FINDINGS: There is no evidence of fracture, dislocation, or joint effusion.
There is no evidence of arthropathy or other focal bone abnormality.
Soft tissues are unremarkable.
IMPRESSION: Negative.

## 2016-07-01 IMAGING — NM NM HEPATO W/GB/PHARM/[PERSON_NAME]
3 series · 13 of 13 positions shown · non-contrast
Comparison: None.

CLINICAL DATA: Right upper quadrant abdominal pain.

EXAM:
NUCLEAR MEDICINE HEPATOBILIARY IMAGING WITH GALLBLADDER EF
TECHNIQUE: Sequential images of the abdomen were obtained [DATE] minutes
following intravenous administration of radiopharmaceutical. After
slow intravenous infusion of 2.11 micrograms Cholecystokinin,
gallbladder ejection fraction was determined.
RADIOPHARMACEUTICALS:  5.0 mCi Uc-SSm Choletec IV

[he hepatobiliary · 3.43mm/px · 6 of 60 frames shown (1 of 3)]
[frame 6/60]
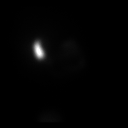
[frame 16/60]
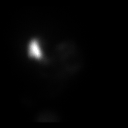
[frame 26/60]
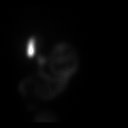
[frame 36/60]
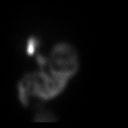
[frame 46/60]
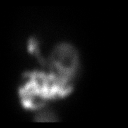
[frame 56/60]
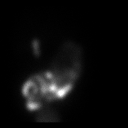

[he hepatobiliary · 1 of 1 slices shown (2 of 3)]
[im 1/1]
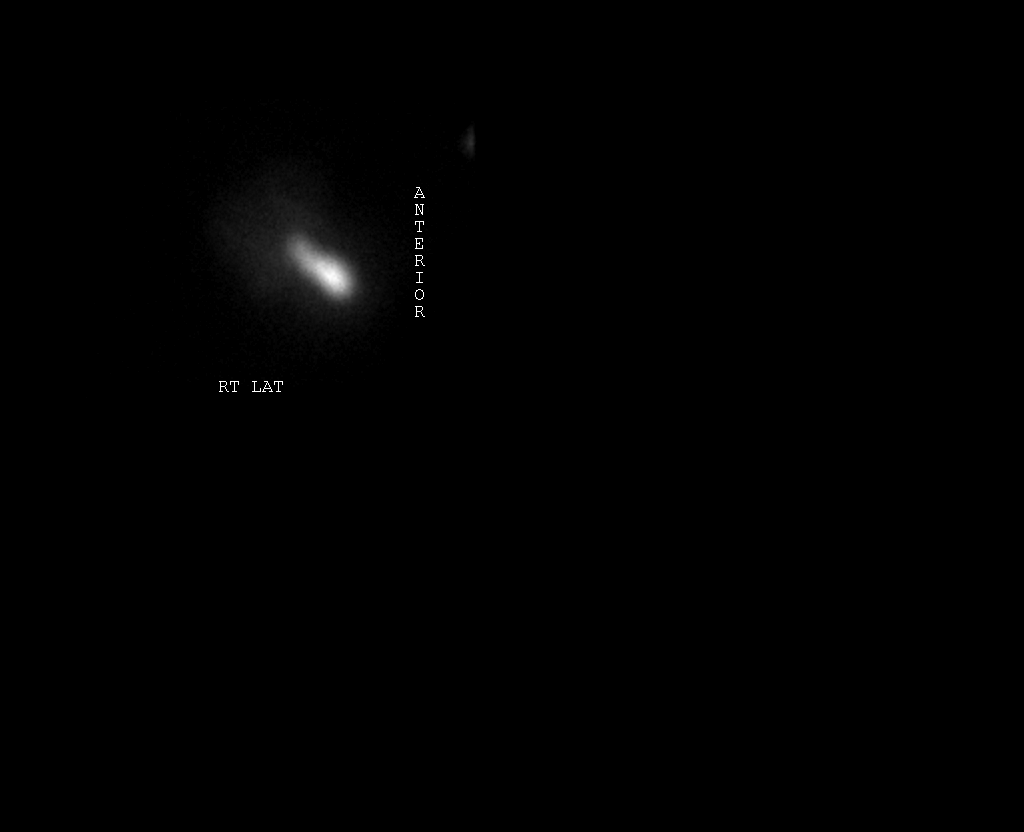

[he hepatobiliary · 3.43mm/px · 6 of 60 frames shown (3 of 3)]
[frame 6/60]
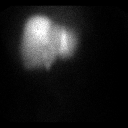
[frame 16/60]
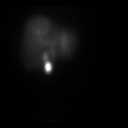
[frame 26/60]
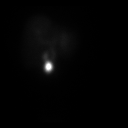
[frame 36/60]
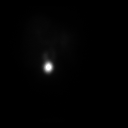
[frame 46/60]
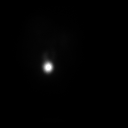
[frame 56/60]
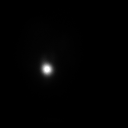

[13 of 13 positions shown; findings below may reference images not displayed]

FINDINGS: Normal uptake is evident within the liver. There is early
concentration in the gallbladder between 10 and 15 minutes.

Following injection of CCK, there is normal emptying of the
gallbladder. The ejection fraction at 60 minutes is 93%, within
normal limits.. At 45 min, normal ejection fraction is greater than
40%.
IMPRESSION: Normal hepatobiliary scan following CCK.

Normal ejection fraction of the gallbladder.

## 2017-10-22 ENCOUNTER — Ambulatory Visit (INDEPENDENT_AMBULATORY_CARE_PROVIDER_SITE_OTHER): Payer: 59 | Admitting: Family

## 2017-10-22 ENCOUNTER — Encounter: Payer: Self-pay | Admitting: Family

## 2017-10-22 VITALS — BP 142/98 | HR 82 | Temp 98.1°F | Ht 67.0 in | Wt 251.6 lb

## 2017-10-22 DIAGNOSIS — E669 Obesity, unspecified: Secondary | ICD-10-CM

## 2017-10-22 DIAGNOSIS — I10 Essential (primary) hypertension: Secondary | ICD-10-CM

## 2017-10-22 DIAGNOSIS — Z23 Encounter for immunization: Secondary | ICD-10-CM | POA: Diagnosis not present

## 2017-10-22 DIAGNOSIS — K219 Gastro-esophageal reflux disease without esophagitis: Secondary | ICD-10-CM

## 2017-10-22 DIAGNOSIS — Z0000 Encounter for general adult medical examination without abnormal findings: Secondary | ICD-10-CM

## 2017-10-22 MED ORDER — OMEPRAZOLE 20 MG PO CPDR: 20.0000 mg | DELAYED_RELEASE_CAPSULE | Freq: Every day | ORAL | 1 refills | Status: DC

## 2017-10-22 MED ORDER — HYDROCHLOROTHIAZIDE 12.5 MG PO TABS: 12.5000 mg | ORAL_TABLET | Freq: Every day | ORAL | 3 refills | Status: DC

## 2017-10-22 NOTE — Progress Notes (Signed)
Subjective:    Patient ID: Marissa Blake, female    DOB: [birth year], 27 y.o.   MRN: [id number]  PT presents to the office today for CPE without pap. Pt currently not taking any medications at this time.  States she has an appt with GYN in a few weeks. Pt denies any headache, palpitations, SOB, or edema at this time.  Gastroesophageal Reflux  She complains of abdominal pain, belching, heartburn and nausea. This is a new problem. The current episode started more than 1 month ago. The problem occurs frequently. The problem has been gradually worsening. The symptoms are aggravated by certain foods, bending, caffeine, medications and stress. She has tried an antacid for the symptoms. The treatment provided mild relief.  Hypertension  This is a new problem. The current episode started more than 1 month ago. The problem is unchanged. The problem is uncontrolled. Pertinent negatives include no malaise/fatigue, peripheral edema or shortness of breath. The current treatment provides no improvement.      Review of Systems  Constitutional: Negative for malaise/fatigue.  Respiratory: Negative for shortness of breath.   Gastrointestinal: Positive for abdominal pain, heartburn and nausea.  All other systems reviewed and are negative.      Objective:   Physical Exam  Constitutional: She is oriented to person, place, and time. She appears well-developed and well-nourished. No distress.  HENT:  Head: Normocephalic and atraumatic.  Right Ear: External ear normal.  Left Ear: External ear normal.  Nose: Nose normal.  Mouth/Throat: Oropharynx is clear and moist.  Eyes: Pupils are equal, round, and reactive to light.  Neck: Normal range of motion. Neck supple. No thyromegaly present.  Cardiovascular: Normal rate, regular rhythm, normal heart sounds and intact distal pulses.  No murmur heard. Pulmonary/Chest: Effort normal and breath sounds normal. No respiratory distress. She has no wheezes.    Abdominal: Soft. Bowel sounds are normal. She exhibits no distension. There is no tenderness.  Musculoskeletal: Normal range of motion. She exhibits no edema or tenderness.  Neurological: She is alert and oriented to person, place, and time.  Skin: Skin is warm and dry.  Psychiatric: She has a normal mood and affect. Her behavior is normal. Judgment and thought content normal.  Vitals reviewed.     BP (!) 149/112   Pulse 68   Temp 98.1 F (36.7 C) (Oral)   Ht 5' 7"  (1.702 m)   Wt 251 lb 9.6 oz (114.1 kg)   BMI 39.41 kg/m      Assessment & Plan:  1. Annual physical exam - Anemia Profile B - CMP14+EGFR - Lipid panel - TSH - VITAMIN D 25 Hydroxy (Vit-D Deficiency, Fractures)  2. Gastroesophageal reflux disease, esophagitis presence not specified PT started on omeprazole daily -Diet discussed- Avoid fried, spicy, citrus foods, caffeine and alcohol -Do not eat 2-3 hours before bedtime -Encouraged small frequent meals -Avoid NSAID's - CMP14+EGFR - omeprazole (PRILOSEC) 20 MG capsule; Take 1 capsule (20 mg total) by mouth daily.  Dispense: 90 capsule; Refill: 1   3. Obesity (BMI 30-39.9)  4. Essential hypertension Pt started on HCTZ 12.5 mg today -Daily blood pressure log given with instructions on how to fill out and told to bring to next visit -Dash diet information given -Exercise encouraged - Stress Management  -Continue current meds -RTO in 2 weeks  - hydrochlorothiazide (HYDRODIURIL) 12.5 MG tablet; Take 1 tablet (12.5 mg total) by mouth daily.  Dispense: 90 tablet; Refill: 3   Continue all meds Labs pending Health  Maintenance reviewed Diet and exercise encouraged RTO 2 weeks to recheck HTN  Evelina Dun, FNP

## 2017-10-22 NOTE — Addendum Note (Signed)
Addended by: Almeta MonasSTONE, Nizar Cutler M on: 10/22/2017 04:31 PM   Modules accepted: Orders

## 2017-10-22 NOTE — Patient Instructions (Signed)

## 2017-10-23 ENCOUNTER — Other Ambulatory Visit: Payer: Self-pay | Admitting: Family

## 2017-10-23 DIAGNOSIS — D509 Iron deficiency anemia, unspecified: Secondary | ICD-10-CM | POA: Insufficient documentation

## 2017-10-23 LAB — CMP14+EGFR
A/G RATIO: 1.1 — AB (ref 1.2–2.2)
ALK PHOS: 113 IU/L (ref 39–117)
ALT: 13 IU/L (ref 0–32)
AST: 8 IU/L (ref 0–40)
Albumin: 3.9 g/dL (ref 3.5–5.5)
BUN/Creatinine Ratio: 16 (ref 9–23)
BUN: 12 mg/dL (ref 6–20)
Bilirubin Total: <0.2 mg/dL (ref 0.0–1.2)
CO2: 23 mmol/L (ref 20–29)
Calcium: 9.3 mg/dL (ref 8.7–10.2)
Chloride: 101 mmol/L (ref 96–106)
Creatinine, Ser: 0.73 mg/dL (ref 0.57–1.00)
GFR calc Af Amer: 130 mL/min/{1.73_m2} (ref >59)
GFR calc non Af Amer: 112 mL/min/{1.73_m2} (ref >59)
GLOBULIN, TOTAL: 3.7 g/dL (ref 1.5–4.5)
Glucose: 90 mg/dL (ref 65–99)
POTASSIUM: 4.7 mmol/L (ref 3.5–5.2)
SODIUM: 140 mmol/L (ref 134–144)
Total Protein: 7.6 g/dL (ref 6.0–8.5)

## 2017-10-23 LAB — ANEMIA PROFILE B
BASOS: 0 %
Basophils Absolute: 0 10*3/uL (ref 0.0–0.2)
EOS (ABSOLUTE): 0.2 10*3/uL (ref 0.0–0.4)
EOS: 1 %
FERRITIN: 71 ng/mL (ref 15–150)
FOLATE: 12.9 ng/mL (ref >3.0)
HEMATOCRIT: 37.9 % (ref 34.0–46.6)
HEMOGLOBIN: 11.9 g/dL (ref 11.1–15.9)
IRON SATURATION: 8 % — AB (ref 15–55)
Immature Grans (Abs): 0 10*3/uL (ref 0.0–0.1)
Immature Granulocytes: 0 %
Iron: 21 ug/dL — ABNORMAL LOW (ref 27–159)
LYMPHS ABS: 3.1 10*3/uL (ref 0.7–3.1)
Lymphs: 20 %
MCH: 27 pg (ref 26.6–33.0)
MCHC: 31.4 g/dL — ABNORMAL LOW (ref 31.5–35.7)
MCV: 86 fL (ref 79–97)
MONOS ABS: 1.1 10*3/uL — AB (ref 0.1–0.9)
Monocytes: 7 %
NEUTROS ABS: 10.7 10*3/uL — AB (ref 1.4–7.0)
Neutrophils: 72 %
Platelets: 527 10*3/uL — ABNORMAL HIGH (ref 150–379)
RBC: 4.41 x10E6/uL (ref 3.77–5.28)
RDW: 13.2 % (ref 12.3–15.4)
Retic Ct Pct: 1.3 % (ref 0.6–2.6)
Total Iron Binding Capacity: 264 ug/dL (ref 250–450)
UIBC: 243 ug/dL (ref 131–425)
VITAMIN B 12: 777 pg/mL (ref 232–1245)
WBC: 15.1 10*3/uL — AB (ref 3.4–10.8)

## 2017-10-23 LAB — LIPID PANEL
CHOL/HDL RATIO: 4 ratio (ref 0.0–4.4)
Cholesterol, Total: 172 mg/dL (ref 100–199)
HDL: 43 mg/dL (ref >39)
LDL Calculated: 92 mg/dL (ref 0–99)
TRIGLYCERIDES: 187 mg/dL — AB (ref 0–149)
VLDL Cholesterol Cal: 37 mg/dL (ref 5–40)

## 2017-10-23 LAB — TSH: TSH: 1.68 u[IU]/mL (ref 0.450–4.500)

## 2017-10-23 LAB — VITAMIN D 25 HYDROXY (VIT D DEFICIENCY, FRACTURES): Vit D, 25-Hydroxy: 19.5 ng/mL — ABNORMAL LOW (ref 30.0–100.0)

## 2017-10-24 ENCOUNTER — Telehealth: Payer: Self-pay | Admitting: Family

## 2017-10-24 NOTE — Telephone Encounter (Signed)
Left message for patient to call.

## 2017-11-04 ENCOUNTER — Encounter: Payer: Self-pay | Admitting: Family

## 2017-11-04 ENCOUNTER — Ambulatory Visit (INDEPENDENT_AMBULATORY_CARE_PROVIDER_SITE_OTHER): Payer: 59 | Admitting: Family

## 2017-11-04 VITALS — BP 114/77 | HR 65 | Temp 98.7°F | Ht 67.0 in | Wt 246.7 lb

## 2017-11-04 DIAGNOSIS — R110 Nausea: Secondary | ICD-10-CM | POA: Diagnosis not present

## 2017-11-04 DIAGNOSIS — K2900 Acute gastritis without bleeding: Secondary | ICD-10-CM | POA: Diagnosis not present

## 2017-11-04 DIAGNOSIS — R1011 Right upper quadrant pain: Secondary | ICD-10-CM

## 2017-11-04 MED ORDER — VITAMIN D (ERGOCALCIFEROL) 1.25 MG (50000 UNIT) PO CAPS: 50000.0000 [IU] | ORAL_CAPSULE | ORAL | 3 refills | Status: DC

## 2017-11-04 NOTE — Progress Notes (Signed)
   Subjective:    Patient ID: Marissa BiddingAmber L Deininger, female    DOB: 03/06/1991, 27 y.o.   MRN: [id number]   HPI PT presents to the office today for recurrent nausea that started over two months ago. PT's last menstrual cycle was 02/4-02/08.  Pt was seen on 10/22/17 and was started on omeprazole. Pt states this has helped with her indigestion, but continues to have nausea every day. States she has an intermittent burning pain in her stomach of 4 out 10.   Review of Systems  Gastrointestinal: Positive for abdominal pain and nausea. Negative for blood in stool, constipation and diarrhea.  All other systems reviewed and are negative.      Objective:   Physical Exam  Constitutional: She is oriented to person, place, and time. She appears well-developed and well-nourished. No distress.  HENT:  Head: Normocephalic and atraumatic.  Right Ear: External ear normal.  Left Ear: External ear normal.  Nose: Nose normal.  Mouth/Throat: Oropharynx is clear and moist.  Eyes: Pupils are equal, round, and reactive to light.  Neck: Normal range of motion. Neck supple. No thyromegaly present.  Cardiovascular: Normal rate, regular rhythm, normal heart sounds and intact distal pulses.  No murmur heard. Pulmonary/Chest: Effort normal and breath sounds normal. No respiratory distress. She has no wheezes.  Abdominal: Soft. Bowel sounds are normal. She exhibits no distension. There is no tenderness.  Musculoskeletal: Normal range of motion. She exhibits no edema or tenderness.  Neurological: She is alert and oriented to person, place, and time.  Skin: Skin is warm and dry.  Psychiatric: She has a normal mood and affect. Her behavior is normal. Judgment and thought content normal.  Vitals reviewed.   BP 114/77   Pulse 65   Temp 98.7 F (37.1 C) (Oral)   Ht 5\' 7"  (1.702 m)   Wt 246 lb 11.2 oz (111.9 kg)   BMI 38.64 kg/m       Assessment & Plan:  1. Nausea - CBC with Differential/Platelet - H Pylori,  IGM, IGG, IGA AB  2. Right upper quadrant abdominal pain - CBC with Differential/Platelet - H Pylori, IGM, IGG, IGA AB  3. Acute gastritis without hemorrhage, unspecified gastritis type Avoid alcohol and NSAID's Small and frequent meals Labs pending Increase PPI to BID from daily RTO in 3 motnhs to recheck anemia     Jannifer Rodneyhristy Becky Berberian, FNP

## 2017-11-04 NOTE — Patient Instructions (Signed)
Gastritis, Adult Gastritis is inflammation of the stomach. There are two kinds of gastritis:  Acute gastritis. This kind develops suddenly.  Chronic gastritis. This kind lasts for a long time.  Gastritis happens when the lining of the stomach becomes weak or gets damaged. Without treatment, gastritis can lead to stomach bleeding and ulcers. What are the causes? This condition may be caused by:  An infection.  Drinking too much alcohol.  Certain medicines.  Having too much acid in the stomach.  A disease of the intestines or stomach.  Stress.  What are the signs or symptoms? Symptoms of this condition include:  Pain or a burning in the upper abdomen.  Nausea.  Vomiting.  An uncomfortable feeling of fullness after eating.  In some cases, there are no symptoms. How is this diagnosed? This condition may be diagnosed with:  A description of your symptoms.  A physical exam.  Tests. These can include: ? Blood tests. ? Stool tests. ? A test in which a thin, flexible instrument with a light and camera on the end is passed down the esophagus and into the stomach (upper endoscopy). ? A test in which a sample of tissue is taken for testing (biopsy).  How is this treated? This condition may be treated with medicines. If the condition is caused by a bacterial infection, you may be given antibiotic medicines. If it is caused by too much acid in the stomach, you may get medicines called H2 blockers, proton pump inhibitors, or antacids. Treatment may also involve stopping the use of certain medicines, such as aspirin, ibuprofen, or other nonsteroidal anti-inflammatory drugs (NSAIDs). Follow these instructions at home:  Take over-the-counter and prescription medicines only as told by your health care provider.  If you were prescribed an antibiotic, take it as told by your health care provider. Do not stop taking the antibiotic even if you start to feel better.  Drink enough  fluid to keep your urine clear or pale yellow.  Eat small, frequent meals instead of large meals. Contact a health care provider if:  Your symptoms get worse.  Your symptoms return after treatment. Get help right away if:  You vomit blood or material that looks like coffee grounds.  You have black or dark red stools.  You are unable to keep fluids down.  Your abdominal pain gets worse.  You have a fever.  You do not feel better after 1 week. This information is not intended to replace advice given to you by your health care provider. Make sure you discuss any questions you have with your health care provider. Document Released: 08/20/2001 Document Revised: 04/24/2016 Document Reviewed: 05/20/2015 Elsevier Interactive Patient Education  2018 [employer]  

## 2017-11-05 LAB — CBC WITH DIFFERENTIAL/PLATELET
BASOS: 0 %
Basophils Absolute: 0 10*3/uL (ref 0.0–0.2)
EOS (ABSOLUTE): 0.1 10*3/uL (ref 0.0–0.4)
Eos: 1 %
Hematocrit: 35.6 % (ref 34.0–46.6)
Hemoglobin: 11.8 g/dL (ref 11.1–15.9)
IMMATURE GRANULOCYTES: 0 %
Immature Grans (Abs): 0 10*3/uL (ref 0.0–0.1)
LYMPHS ABS: 3.4 10*3/uL — AB (ref 0.7–3.1)
Lymphs: 24 %
MCH: 27.2 pg (ref 26.6–33.0)
MCHC: 33.1 g/dL (ref 31.5–35.7)
MCV: 82 fL (ref 79–97)
MONOS ABS: 1.1 10*3/uL — AB (ref 0.1–0.9)
Monocytes: 8 %
NEUTROS ABS: 9.8 10*3/uL — AB (ref 1.4–7.0)
NEUTROS PCT: 67 %
PLATELETS: 478 10*3/uL — AB (ref 150–379)
RBC: 4.34 x10E6/uL (ref 3.77–5.28)
RDW: 13.3 % (ref 12.3–15.4)
WBC: 14.5 10*3/uL — ABNORMAL HIGH (ref 3.4–10.8)

## 2017-11-05 LAB — H PYLORI, IGM, IGG, IGA AB
H. pylori, IgA Abs: <9 units (ref 0.0–8.9)
H. pylori, IgG AbS: <0.8 Index Value (ref 0.00–0.79)

## 2017-11-06 ENCOUNTER — Ambulatory Visit: Payer: 59 | Admitting: Family

## 2017-11-06 ENCOUNTER — Other Ambulatory Visit: Payer: Self-pay | Admitting: Family

## 2017-11-06 DIAGNOSIS — R110 Nausea: Secondary | ICD-10-CM

## 2017-11-06 DIAGNOSIS — R1011 Right upper quadrant pain: Secondary | ICD-10-CM

## 2017-11-11 ENCOUNTER — Ambulatory Visit (HOSPITAL_COMMUNITY): Payer: 59 | Attending: Family

## 2017-11-14 ENCOUNTER — Telehealth: Payer: Self-pay | Admitting: Family

## 2017-11-14 ENCOUNTER — Other Ambulatory Visit: Payer: Self-pay | Admitting: *Deleted

## 2017-11-14 DIAGNOSIS — K219 Gastro-esophageal reflux disease without esophagitis: Secondary | ICD-10-CM

## 2017-11-14 MED ORDER — OMEPRAZOLE 20 MG PO CPDR: 20.0000 mg | DELAYED_RELEASE_CAPSULE | Freq: Every day | ORAL | 1 refills | Status: DC

## 2017-11-14 MED ORDER — OMEPRAZOLE 20 MG PO CPDR: 20.0000 mg | DELAYED_RELEASE_CAPSULE | Freq: Two times a day (BID) | ORAL | 1 refills | Status: DC

## 2017-11-14 NOTE — Telephone Encounter (Signed)
Prescription sent to pharmacy.

## 2017-11-14 NOTE — Telephone Encounter (Signed)
Sent in current omeprazole script but patient says BID and send to [facility], [place]. Please address.

## 2017-11-21 ENCOUNTER — Ambulatory Visit (HOSPITAL_COMMUNITY)
Admission: RE | Admit: 2017-11-21 | Discharge: 2017-11-21 | Disposition: A | Discharge status: Home / Self Care | Payer: 59 | Source: Ambulatory Visit | Attending: Family | Admitting: Family

## 2017-11-21 DIAGNOSIS — R1011 Right upper quadrant pain: Secondary | ICD-10-CM | POA: Diagnosis present

## 2017-11-21 DIAGNOSIS — R110 Nausea: Secondary | ICD-10-CM

## 2018-06-05 LAB — OB RESULTS CONSOLE RPR: RPR: NONREACTIVE

## 2018-06-05 LAB — OB RESULTS CONSOLE GC/CHLAMYDIA
Chlamydia: NEGATIVE
Gonorrhea: NEGATIVE

## 2018-06-05 LAB — OB RESULTS CONSOLE HIV ANTIBODY (ROUTINE TESTING): HIV: NONREACTIVE

## 2018-06-05 LAB — OB RESULTS CONSOLE HEPATITIS B SURFACE ANTIGEN: Hepatitis B Surface Ag: NEGATIVE

## 2018-06-05 LAB — OB RESULTS CONSOLE RUBELLA ANTIBODY, IGM: Rubella: IMMUNE

## 2018-09-17 ENCOUNTER — Other Ambulatory Visit: Payer: Self-pay | Admitting: *Deleted

## 2018-09-17 DIAGNOSIS — K219 Gastro-esophageal reflux disease without esophagitis: Secondary | ICD-10-CM

## 2018-11-13 ENCOUNTER — Other Ambulatory Visit: Payer: Self-pay | Admitting: Family

## 2018-11-14 ENCOUNTER — Other Ambulatory Visit: Payer: Self-pay | Admitting: Family

## 2018-11-14 DIAGNOSIS — K219 Gastro-esophageal reflux disease without esophagitis: Secondary | ICD-10-CM

## 2018-12-03 LAB — OB RESULTS CONSOLE GBS: GBS: NEGATIVE

## 2018-12-25 ENCOUNTER — Other Ambulatory Visit: Payer: Self-pay | Admitting: Obstetrics

## 2018-12-25 ENCOUNTER — Inpatient Hospital Stay (EMERGENCY_DEPARTMENT_HOSPITAL)
Admission: AD | Admit: 2018-12-25 | Discharge: 2018-12-25 | Disposition: A | Discharge status: Home / Self Care | Payer: 59 | Source: Home / Self Care | Attending: Obstetrics | Admitting: Obstetrics

## 2018-12-25 ENCOUNTER — Other Ambulatory Visit: Payer: Self-pay

## 2018-12-25 ENCOUNTER — Encounter (HOSPITAL_COMMUNITY): Payer: Self-pay | Admitting: *Deleted

## 2018-12-25 DIAGNOSIS — O479 False labor, unspecified: Secondary | ICD-10-CM

## 2018-12-25 DIAGNOSIS — Z3A39 39 weeks gestation of pregnancy: Secondary | ICD-10-CM

## 2018-12-25 DIAGNOSIS — O471 False labor at or after 37 completed weeks of gestation: Secondary | ICD-10-CM

## 2018-12-25 NOTE — MAU Provider Note (Signed)
.  S: Ms. MITHILA RIOLO is a 28 y.o. G1P0 at [redacted]w[redacted]d  who presents to MAU today for labor evaluation.     Cervical exam by RN:  Dilation: 1.5 Effacement (%): 80 Cervical Position: Posterior Station: -3 Presentation: Vertex Exam by:: Camelia Eng RN  Fetal Monitoring: Baseline: 140 Variability: average Accelerations: present Decelerations: absent Contractions: irregular  MDM Discussed patient with RN. NST reviewed.   A: [facility] at [redacted]w[redacted]d  False labor  P: Discharge home Labor precautions and kick counts included in AVS Patient to follow-up with [facility] as scheduled  Patient may return to MAU as needed or when in labor   Aviva Signs, [place] 12/25/2018 4:08 AM

## 2018-12-25 NOTE — MAU Note (Addendum)
Assumed care of patient  Pt reports contractions every few mins. Denies LOF or vaginal bleeding. Reports good fetal movement. Last cervical exam was 1cm. Reports she has had an uncomplicated pregnancy.

## 2018-12-25 NOTE — MAU Note (Signed)
I have communicated with Wynelle Bourgeois CNM and reviewed vital signs:  Vitals:   12/25/18 0321 12/25/18 0323  BP: 125/88 125/88  Pulse: 93 92  Resp:  20  Temp:  98.4 F (36.9 C)  SpO2:  100%    Vaginal exam:  Dilation: 1.5 Effacement (%): 80 Cervical Position: Posterior Station: -3 Presentation: Vertex Exam by:: Camelia Eng RN,   Also reviewed contraction pattern and that non-stress test is reactive.  It has been documented that patient is contracting irregularly with no cervical change over 1.5 hours not indicating active labor.  Patient denies any other complaints. Tracing reviewed by provider and RN. Based on this report provider has given order for discharge.  A discharge order and diagnosis entered by a provider.   Labor discharge instructions reviewed with patient.

## 2018-12-25 NOTE — Discharge Instructions (Signed)

## 2018-12-27 ENCOUNTER — Other Ambulatory Visit (HOSPITAL_COMMUNITY): Payer: Self-pay | Admitting: *Deleted

## 2018-12-27 NOTE — H&P (Signed)
Marissa Blake is a 28 y.o. G1P0 at [redacted]w[redacted]d presenting for IOL for chronic htn. Pt notes intermittent contractions. Good fetal movement, No vaginal bleeding, not leaking fluid .  Admitted [place], cytotec given, increased ctx since cytotec, nubain, several doses needed. Pt with poor sleep o/n, able to rest some this am with meds. Now 1 hr into epidural, reports burning in urethra with ctx.   [facility] since 8 wks - chronic htn, stopped HCTZ with preg. On baby ASA. 3rd trimester testing - obesity. Early DS 144, nl 3 hr, nl early DS - fetal growth- 8'9. 94%, AC 99% -    Prenatal Transfer Tool  Maternal Diabetes: No Genetic Screening: Declined Maternal Ultrasounds/Referrals: Normal Fetal Ultrasounds or other Referrals:  None Maternal Substance Abuse:  No Significant Maternal Medications:  None Significant Maternal Lab Results: None     OB History    Gravida  1   Para      Term      Preterm      AB      Living        SAB      TAB      Ectopic      Multiple      Live Births             Past Medical History:  Diagnosis Date  . Heart murmur    Past Surgical History:  Procedure Laterality Date  . none    . WISDOM TOOTH EXTRACTION     Family History: family history includes Autism in her sister; Hypertension in her father. Social History:  reports that she has never smoked. She has never used smokeless tobacco. She reports previous alcohol use. She reports that she does not use drugs.  Review of Systems - Negative except discomfort of preg   Vitals:   12/28/18 1426 12/28/18 1431 12/28/18 1438 12/28/18 1500  BP: 123/73 125/76 127/74 128/74  Pulse: 79 82 93 93  Resp: 14  18 17   Temp:      TempSrc:      SpO2: 98% 96% 96%   Weight:      Height:        Physical Exam:  Gen: well appearing, no distress Back: no CVAT Abd: gravid, NT, no RUQ pain LE: trace edema, equal bilaterally, non-tender Toco: q2 min FH: baseline 120s, accelerations  present, no deceleratons, 10 beat variability  Cvx: 4/100%/ vtx -2, molding, asynclitic Prenatal labs: ABO, Rh:   Antibody:  neg Rubella:  RI RPR:   NR HBsAg:   neg HIV:   neg GBS:   neg 1 hr Glucola 115  Genetic screening declined Anatomy US normal  CBC    Component Value Date/Time   WBC 16.9 (H) 12/28/2018 1250   RBC 4.09 12/28/2018 1250   HGB 11.1 (L) 12/28/2018 1250   HGB 11.8 11/04/2017 1457   HCT 34.7 (L) 12/28/2018 1250   HCT 35.6 11/04/2017 1457   PLT 328 12/28/2018 1250   PLT 478 (H) 11/04/2017 1457   MCV 84.8 12/28/2018 1250   MCV 82 11/04/2017 1457   MCH 27.1 12/28/2018 1250   MCHC 32.0 12/28/2018 1250   RDW 13.2 12/28/2018 1250   RDW 13.3 11/04/2017 1457   LYMPHSABS 3.4 (H) 11/04/2017 1457   EOSABS 0.1 11/04/2017 1457   BASOSABS 0.0 11/04/2017 1457    CMP     Component Value Date/Time   NA 132 (L) 12/28/2018 0016   NA 140 10/22/2017  1553   K 3.6 12/28/2018 0016   CL 106 12/28/2018 0016   CO2 20 (L) 12/28/2018 0016   GLUCOSE 95 12/28/2018 0016   BUN 5 (L) 12/28/2018 0016   BUN 12 10/22/2017 1553   CREATININE 0.59 12/28/2018 0016   CALCIUM 8.6 (L) 12/28/2018 0016   PROT 7.0 12/28/2018 0016   PROT 7.6 10/22/2017 1553   ALBUMIN 2.8 (L) 12/28/2018 0016   ALBUMIN 3.9 10/22/2017 1553   AST 17 12/28/2018 0016   ALT 11 12/28/2018 0016   ALKPHOS 122 12/28/2018 0016   BILITOT 0.5 12/28/2018 0016   BILITOT <0.2 10/22/2017 1553   GFRNONAA >60 12/28/2018 0016   GFRAA >60 12/28/2018 0016     Assessment/Plan: 28 y.o. G1P0 at 10058w4d - IOL. cytotec o/n, pit in am. Slow but adequate progress, continue pitocin - urthral pain. Will try topical lidocaine, if not helping may need I&O cath or replacement - LGA - GBS neg - chronic htn, PIH labs on admit, no evidence PEC   Tresa EndoKelly A Obe Ahlers 12/28/2018 3:30 PM

## 2018-12-28 ENCOUNTER — Inpatient Hospital Stay (HOSPITAL_COMMUNITY)
Admission: AD | Admit: 2018-12-28 | Discharge: 2018-12-31 | DRG: 787 | Disposition: A | Discharge status: Home / Self Care | Payer: 59 | Attending: Obstetrics | Admitting: Obstetrics

## 2018-12-28 ENCOUNTER — Encounter (HOSPITAL_COMMUNITY)
Admission: AD | Disposition: A | Discharge status: Home / Self Care | Payer: Self-pay | Source: Home / Self Care | Attending: Obstetrics

## 2018-12-28 ENCOUNTER — Inpatient Hospital Stay (HOSPITAL_COMMUNITY): Payer: 59 | Admitting: Anesthesiology

## 2018-12-28 ENCOUNTER — Inpatient Hospital Stay (HOSPITAL_COMMUNITY): Payer: 59

## 2018-12-28 ENCOUNTER — Other Ambulatory Visit: Payer: Self-pay

## 2018-12-28 ENCOUNTER — Encounter (HOSPITAL_COMMUNITY): Payer: Self-pay

## 2018-12-28 DIAGNOSIS — O9902 Anemia complicating childbirth: Secondary | ICD-10-CM | POA: Diagnosis present

## 2018-12-28 DIAGNOSIS — I10 Essential (primary) hypertension: Secondary | ICD-10-CM

## 2018-12-28 DIAGNOSIS — D62 Acute posthemorrhagic anemia: Secondary | ICD-10-CM | POA: Diagnosis not present

## 2018-12-28 DIAGNOSIS — O9962 Diseases of the digestive system complicating childbirth: Secondary | ICD-10-CM | POA: Diagnosis present

## 2018-12-28 DIAGNOSIS — Z3490 Encounter for supervision of normal pregnancy, unspecified, unspecified trimester: Secondary | ICD-10-CM | POA: Diagnosis present

## 2018-12-28 DIAGNOSIS — O9081 Anemia of the puerperium: Secondary | ICD-10-CM | POA: Diagnosis not present

## 2018-12-28 DIAGNOSIS — O99214 Obesity complicating childbirth: Secondary | ICD-10-CM | POA: Diagnosis present

## 2018-12-28 DIAGNOSIS — O3663X0 Maternal care for excessive fetal growth, third trimester, not applicable or unspecified: Secondary | ICD-10-CM | POA: Diagnosis present

## 2018-12-28 DIAGNOSIS — K219 Gastro-esophageal reflux disease without esophagitis: Secondary | ICD-10-CM | POA: Diagnosis present

## 2018-12-28 DIAGNOSIS — Z3A39 39 weeks gestation of pregnancy: Secondary | ICD-10-CM | POA: Diagnosis not present

## 2018-12-28 DIAGNOSIS — O1002 Pre-existing essential hypertension complicating childbirth: Secondary | ICD-10-CM | POA: Diagnosis present

## 2018-12-28 HISTORY — DX: Anemia, unspecified: D64.9

## 2018-12-28 HISTORY — DX: Gastro-esophageal reflux disease without esophagitis: K21.9

## 2018-12-28 HISTORY — DX: Essential (primary) hypertension: I10

## 2018-12-28 LAB — CBC
HCT: 34.7 % — ABNORMAL LOW (ref 36.0–46.0)
HCT: 34.9 % — ABNORMAL LOW (ref 36.0–46.0)
Hemoglobin: 11.1 g/dL — ABNORMAL LOW (ref 12.0–15.0)
Hemoglobin: 11.1 g/dL — ABNORMAL LOW (ref 12.0–15.0)
MCH: 27.1 pg (ref 26.0–34.0)
MCH: 27.3 pg (ref 26.0–34.0)
MCHC: 31.8 g/dL (ref 30.0–36.0)
MCHC: 32 g/dL (ref 30.0–36.0)
MCV: 84.8 fL (ref 80.0–100.0)
MCV: 86 fL (ref 80.0–100.0)
Platelets: 328 10*3/uL (ref 150–400)
Platelets: 336 10*3/uL (ref 150–400)
RBC: 4.06 MIL/uL (ref 3.87–5.11)
RBC: 4.09 MIL/uL (ref 3.87–5.11)
RDW: 13.2 % (ref 11.5–15.5)
RDW: 13.2 % (ref 11.5–15.5)
WBC: 15.3 10*3/uL — ABNORMAL HIGH (ref 4.0–10.5)
WBC: 16.9 10*3/uL — ABNORMAL HIGH (ref 4.0–10.5)
nRBC: 0 % (ref 0.0–0.2)
nRBC: 0 % (ref 0.0–0.2)

## 2018-12-28 LAB — COMPREHENSIVE METABOLIC PANEL
ALT: 11 U/L (ref 0–44)
AST: 17 U/L (ref 15–41)
Albumin: 2.8 g/dL — ABNORMAL LOW (ref 3.5–5.0)
Alkaline Phosphatase: 122 U/L (ref 38–126)
Anion gap: 6 (ref 5–15)
BUN: 5 mg/dL — ABNORMAL LOW (ref 6–20)
CO2: 20 mmol/L — ABNORMAL LOW (ref 22–32)
Calcium: 8.6 mg/dL — ABNORMAL LOW (ref 8.9–10.3)
Chloride: 106 mmol/L (ref 98–111)
Creatinine, Ser: 0.59 mg/dL (ref 0.44–1.00)
GFR calc Af Amer: >60 mL/min (ref >60)
GFR calc non Af Amer: >60 mL/min (ref >60)
Glucose, Bld: 95 mg/dL (ref 70–99)
Potassium: 3.6 mmol/L (ref 3.5–5.1)
Sodium: 132 mmol/L — ABNORMAL LOW (ref 135–145)
Total Bilirubin: 0.5 mg/dL (ref 0.3–1.2)
Total Protein: 7 g/dL (ref 6.5–8.1)

## 2018-12-28 LAB — RPR: RPR Ser Ql: NONREACTIVE

## 2018-12-28 LAB — TYPE AND SCREEN
ABO/RH(D): B POS
Antibody Screen: NEGATIVE

## 2018-12-28 LAB — URIC ACID: Uric Acid, Serum: 4.4 mg/dL (ref 2.5–7.1)

## 2018-12-28 SURGERY — Surgical Case
Anesthesia: Epidural

## 2018-12-28 MED ORDER — FENTANYL-BUPIVACAINE-NACL 0.5-0.125-0.9 MG/250ML-% EP SOLN: 12.0000 mL/h | EPIDURAL | Status: DC | PRN

## 2018-12-28 MED ORDER — MISOPROSTOL 25 MCG QUARTER TABLET
25.0000 ug | ORAL_TABLET | ORAL | Status: DC | PRN
  Administered 2018-12-28: 01:00:00 25 ug via VAGINAL
  Filled 2018-12-28: qty 1

## 2018-12-28 MED ORDER — OXYTOCIN 40 UNITS IN NORMAL SALINE INFUSION - SIMPLE MED: 2.5000 [IU]/h | INTRAVENOUS | Status: DC

## 2018-12-28 MED ORDER — LIDOCAINE-EPINEPHRINE (PF) 2 %-1:200000 IJ SOLN
INTRAMUSCULAR | Status: AC
  Filled 2018-12-28: qty 10

## 2018-12-28 MED ORDER — ONDANSETRON HCL 4 MG/2ML IJ SOLN
INTRAMUSCULAR | Status: AC
  Filled 2018-12-28: qty 2

## 2018-12-28 MED ORDER — EPHEDRINE 5 MG/ML INJ: 10.0000 mg | INTRAVENOUS | Status: DC | PRN

## 2018-12-28 MED ORDER — LIDOCAINE HCL (PF) 1 % IJ SOLN
INTRAMUSCULAR | Status: DC | PRN
  Administered 2018-12-28: 4 mL via EPIDURAL
  Administered 2018-12-28: 6 mL via EPIDURAL

## 2018-12-28 MED ORDER — PHENYLEPHRINE 40 MCG/ML (10ML) SYRINGE FOR IV PUSH (FOR BLOOD PRESSURE SUPPORT): 80.0000 ug | PREFILLED_SYRINGE | INTRAVENOUS | Status: DC | PRN

## 2018-12-28 MED ORDER — LACTATED RINGERS IV SOLN
INTRAVENOUS | Status: DC
  Administered 2018-12-28 (×2): via INTRAVENOUS
  Administered 2018-12-28: 125 mL via INTRAVENOUS
  Administered 2018-12-28 (×2): via INTRAVENOUS

## 2018-12-28 MED ORDER — ACETAMINOPHEN 325 MG PO TABS: 650.0000 mg | ORAL_TABLET | ORAL | Status: DC | PRN

## 2018-12-28 MED ORDER — OXYTOCIN BOLUS FROM INFUSION: 500.0000 mL | Freq: Once | INTRAVENOUS | Status: DC

## 2018-12-28 MED ORDER — NALBUPHINE HCL 10 MG/ML IJ SOLN
10.0000 mg | Freq: Once | INTRAMUSCULAR | Status: AC
  Administered 2018-12-28: 10 mg via INTRAVENOUS
  Filled 2018-12-28: qty 1

## 2018-12-28 MED ORDER — ONDANSETRON HCL 4 MG/2ML IJ SOLN
4.0000 mg | Freq: Four times a day (QID) | INTRAMUSCULAR | Status: DC | PRN
  Administered 2018-12-28 (×2): 4 mg via INTRAVENOUS
  Filled 2018-12-28 (×2): qty 2

## 2018-12-28 MED ORDER — DEXAMETHASONE SODIUM PHOSPHATE 4 MG/ML IJ SOLN
INTRAMUSCULAR | Status: AC
  Filled 2018-12-28: qty 1

## 2018-12-28 MED ORDER — FENTANYL-BUPIVACAINE-NACL 0.5-0.125-0.9 MG/250ML-% EP SOLN
12.0000 mL/h | EPIDURAL | Status: DC | PRN
  Filled 2018-12-28: qty 250

## 2018-12-28 MED ORDER — LIDOCAINE HCL URETHRAL/MUCOSAL 2 % EX GEL
1.0000 "application " | CUTANEOUS | Status: DC | PRN
  Administered 2018-12-28 (×2): 1 via URETHRAL
  Filled 2018-12-28 (×3): qty 5

## 2018-12-28 MED ORDER — SODIUM CHLORIDE (PF) 0.9 % IJ SOLN
INTRAMUSCULAR | Status: DC | PRN
  Administered 2018-12-28: 14 mL/h via EPIDURAL

## 2018-12-28 MED ORDER — DIPHENHYDRAMINE HCL 50 MG/ML IJ SOLN: 12.5000 mg | INTRAMUSCULAR | Status: DC | PRN

## 2018-12-28 MED ORDER — LACTATED RINGERS IV SOLN: 500.0000 mL | INTRAVENOUS | Status: DC | PRN

## 2018-12-28 MED ORDER — TERBUTALINE SULFATE 1 MG/ML IJ SOLN: 0.2500 mg | Freq: Once | INTRAMUSCULAR | Status: DC | PRN

## 2018-12-28 MED ORDER — SOD CITRATE-CITRIC ACID 500-334 MG/5ML PO SOLN
30.0000 mL | ORAL | Status: DC | PRN
  Filled 2018-12-28: qty 15

## 2018-12-28 MED ORDER — LACTATED RINGERS IV SOLN
500.0000 mL | Freq: Once | INTRAVENOUS | Status: AC
  Administered 2018-12-28: 500 mL via INTRAVENOUS

## 2018-12-28 MED ORDER — LIDOCAINE HCL (PF) 1 % IJ SOLN: 30.0000 mL | INTRAMUSCULAR | Status: DC | PRN

## 2018-12-28 MED ORDER — OXYTOCIN 40 UNITS IN NORMAL SALINE INFUSION - SIMPLE MED
1.0000 m[IU]/min | INTRAVENOUS | Status: DC
  Administered 2018-12-28: 09:00:00 2 m[IU]/min via INTRAVENOUS
  Filled 2018-12-28: qty 1000

## 2018-12-28 MED ORDER — NALBUPHINE HCL 10 MG/ML IJ SOLN
10.0000 mg | INTRAMUSCULAR | Status: AC | PRN
  Administered 2018-12-28 (×2): 10 mg via INTRAVENOUS
  Filled 2018-12-28 (×2): qty 1

## 2018-12-28 SURGICAL SUPPLY — 36 items
BENZOIN TINCTURE PRP APPL 2/3 (GAUZE/BANDAGES/DRESSINGS) ×2 IMPLANT
CHLORAPREP W/TINT 26ML (MISCELLANEOUS) ×2 IMPLANT
CLAMP CORD UMBIL (MISCELLANEOUS) IMPLANT
CLOSURE STERI STRIP 1/2 X4 (GAUZE/BANDAGES/DRESSINGS) ×2 IMPLANT
CLOTH BEACON ORANGE TIMEOUT ST (SAFETY) ×2 IMPLANT
DRSG OPSITE POSTOP 4X10 (GAUZE/BANDAGES/DRESSINGS) ×2 IMPLANT
ELECT REM PT RETURN 9FT ADLT (ELECTROSURGICAL) ×2
ELECTRODE REM PT RTRN 9FT ADLT (ELECTROSURGICAL) ×1 IMPLANT
EXTRACTOR VACUUM M CUP 4 TUBE (SUCTIONS) IMPLANT
GLOVE BIO SURGEON STRL SZ 6.5 (GLOVE) ×2 IMPLANT
GLOVE BIOGEL PI IND STRL 7.0 (GLOVE) ×2 IMPLANT
GLOVE BIOGEL PI INDICATOR 7.0 (GLOVE) ×2
GOWN STRL REUS W/TWL LRG LVL3 (GOWN DISPOSABLE) ×4 IMPLANT
HOVERMATT SINGLE USE (MISCELLANEOUS) ×2 IMPLANT
KIT ABG SYR 3ML LUER SLIP (SYRINGE) IMPLANT
NEEDLE HYPO 22GX1.5 SAFETY (NEEDLE) IMPLANT
NEEDLE HYPO 25X5/8 SAFETYGLIDE (NEEDLE) IMPLANT
NS IRRIG 1000ML POUR BTL (IV SOLUTION) ×2 IMPLANT
PACK C SECTION WH (CUSTOM PROCEDURE TRAY) ×2 IMPLANT
PAD OB MATERNITY 4.3X12.25 (PERSONAL CARE ITEMS) ×2 IMPLANT
PENCIL SMOKE EVAC W/HOLSTER (ELECTROSURGICAL) ×2 IMPLANT
RETRACTOR TRAXI PANNICULUS (MISCELLANEOUS) ×1 IMPLANT
STRIP CLOSURE SKIN 1/2X4 (GAUZE/BANDAGES/DRESSINGS) IMPLANT
SUT MON AB 4-0 PS1 27 (SUTURE) ×2 IMPLANT
SUT PLAIN 0 NONE (SUTURE) IMPLANT
SUT PLAIN 2 0 XLH (SUTURE) IMPLANT
SUT VIC AB 0 CT1 36 (SUTURE) ×4 IMPLANT
SUT VIC AB 0 CTX 36 (SUTURE) ×3
SUT VIC AB 0 CTX36XBRD ANBCTRL (SUTURE) ×3 IMPLANT
SUT VIC AB 2-0 CT1 27 (SUTURE) ×1
SUT VIC AB 2-0 CT1 TAPERPNT 27 (SUTURE) ×1 IMPLANT
SYR CONTROL 10ML LL (SYRINGE) IMPLANT
TOWEL OR 17X24 6PK STRL BLUE (TOWEL DISPOSABLE) ×2 IMPLANT
TRAXI PANNICULUS RETRACTOR (MISCELLANEOUS) ×1
TRAY FOLEY W/BAG SLVR 14FR LF (SET/KITS/TRAYS/PACK) IMPLANT
WATER STERILE IRR 1000ML POUR (IV SOLUTION) ×2 IMPLANT

## 2018-12-28 NOTE — Anesthesia Preprocedure Evaluation (Signed)
Anesthesia Evaluation  Patient identified by MRN, date of birth, ID band Patient awake    Reviewed: Allergy & Precautions, NPO status , Patient's Chart, lab work & pertinent test results  History of Anesthesia Complications Negative for: history of anesthetic complications  Airway Mallampati: II  TM Distance: >3 FB Neck ROM: Full    Dental   Pulmonary neg pulmonary ROS,    breath sounds clear to auscultation       Cardiovascular hypertension, Pt. on medications  Rhythm:Regular Rate:Normal     Neuro/Psych negative neurological ROS  negative psych ROS   GI/Hepatic Neg liver ROS, GERD  Medicated,  Endo/Other  Morbid obesity  Renal/GU negative Renal ROS     Musculoskeletal negative musculoskeletal ROS (+)   Abdominal (+) + obese,   Peds  Hematology  (+) anemia ,   Anesthesia Other Findings   Reproductive/Obstetrics (+) Pregnancy                             Anesthesia Physical Anesthesia Plan  ASA: III  Anesthesia Plan: Epidural   Post-op Pain Management:    Induction:   PONV Risk Score and Plan: 2 and Treatment may vary due to age or medical condition  Airway Management Planned: Natural Airway  Additional Equipment: None  Intra-op Plan:   Post-operative Plan:   Informed Consent: I have reviewed the patients History and Physical, chart, labs and discussed the procedure including the risks, benefits and alternatives for the proposed anesthesia with the patient or authorized representative who has indicated his/her understanding and acceptance.       Plan Discussed with: Anesthesiologist  Anesthesia Plan Comments: (Labs reviewed. Platelets acceptable, patient not taking any blood thinning medications. Per RN, FHR tracing reported to be stable enough for sitting procedure. Risks and benefits discussed with patient, including PDPH, backache, epidural hematoma, failed epidural,  allergic reaction, and nerve injury. Patient expressed understanding and wished to proceed.)        Anesthesia Quick Evaluation

## 2018-12-28 NOTE — Progress Notes (Signed)
Marissa Blake is a 28 y.o. G1P0 at [redacted]w[redacted]d, admitted for IOL for well controlled GHTN, [facility] primary Dr Ernestina Penna  Subjective: S/p 2 Cytotec doses, contracting too much for pitocin. One Nubain dose overnight and one 10 minutes back.   Objective: BP 115/68   Pulse 84   Temp 97.7 F (36.5 C) (Oral)   Resp 14   Ht 5\' 6"  (1.676 m)   Wt 116.1 kg   BMI 41.32 kg/m   FHT:  120s now with minimal variability (since mom had Nubain) but overall prior to this mod variab, + accels, no decels- cat I UC:   irregular, every 1-2 minutes SVE:   Dilation: 1.5 Effacement (%): 100 Station: -2 Exam by:: jaton burgess rnc    Assessment / Plan: IOL, G1, S/p Cytotec #1 at 12.30 AM, #2 at 4.30 am. Now contracting, hold pitocin. Ambulate when narcotic wears off, will attempt AROM or cervical foley. FHT cat I   Marissa Blake 12/28/2018, 9:20 AM

## 2018-12-28 NOTE — Progress Notes (Signed)
Marissa Blake is a 28 y.o. G1P0 at [redacted]w[redacted]d by ultrasound admitted for induction of labor due to well controlled GHTN, [facility] primary Dr Ernestina Penna  Subjective: Pain, wants something to help sleep and pain Cytotec 1st dose at 12.30 am   Objective: BP 111/71   Pulse 83   Temp 98.6 F (37 C) (Oral)   Resp 20   Ht 5\' 6"  (1.676 m)   Wt 116.1 kg   BMI 41.32 kg/m   FHT:  FHR: 130s bpm, variability: moderate,  accelerations:  Present,  decelerations:  Absent UC:   irregular, every 3-5 minutes SVE:   Dilation: 1.5 Effacement (%): 80 Station: -3 Exam by:: Marissa Coffin RN at admission  Labs: Lab Results  Component Value Date   WBC 15.3 (H) 12/28/2018   HGB 11.1 (L) 12/28/2018   HCT 34.9 (L) 12/28/2018   MCV 86.0 12/28/2018   PLT 336 12/28/2018    Assessment / Plan: IOL, G1, S/p Cytotec #1 at 12.30 AM Nubain 10mg  IV now and repeat in 3 hrs if needed, epidural okay with active labor or after 3 cm  FHT cat I   Robley Fries 12/28/2018, 3:53 AM

## 2018-12-28 NOTE — Anesthesia Procedure Notes (Signed)
Epidural Patient location during procedure: OB Start time: 12/28/2018 2:03 PM End time: 12/28/2018 2:07 PM  Staffing Anesthesiologist: Beryle Lathe, MD Performed: anesthesiologist   Preanesthetic Checklist Completed: patient identified, pre-op evaluation, timeout performed, IV checked, risks and benefits discussed and monitors and equipment checked  Epidural Patient position: sitting Prep: DuraPrep Patient monitoring: continuous pulse ox and blood pressure Approach: midline Location: L2-L3 Injection technique: LOR saline  Needle:  Needle type: Tuohy  Needle gauge: 17 G Needle length: 9 cm Needle insertion depth: 9 cm Catheter size: 19 Gauge Catheter at skin depth: 15 cm Test dose: negative and Other (1% lidocaine)  Assessment Events: blood not aspirated  Additional Notes Patient identified. Risks including, but not limited to, bleeding, infection, nerve damage, paralysis, inadequate analgesia, blood pressure changes, nausea, vomiting, allergic reaction, postpartum back pain, itching, and headache were discussed. Patient expressed understanding and wished to proceed. Sterile prep and drape, including hand hygiene, mask, and sterile gloves were used. The patient was positioned and the spine was prepped. The skin was anesthetized with lidocaine. No paraesthesia or other complication noted. The patient did not experience any signs of intravascular injection such as tinnitus or metallic taste in mouth, nor signs of intrathecal spread such as rapid motor block. Please see nursing notes for vital signs. The patient tolerated the procedure well.   Leslye Peer, MDReason for block:procedure for pain

## 2018-12-28 NOTE — Progress Notes (Signed)
S: Doing well, no complaints, pain well controlled with epidural.  Foley catheter replaced with use of lidocaine jelly after 2 hours without bladder drainage.  Less than 100 cc returned.  Dark urine.  Patient reports new Foley without pain.  O: BP 140/72   Pulse 95   Temp (!) 97.4 F (36.3 C) (Oral)   Resp 14   Ht 5\' 6"  (1.676 m)   Wt 116.1 kg   SpO2 96%   BMI 41.32 kg/m    FHT:  FHR: 130s bpm, variability: moderate,  accelerations:  Present,  decelerations:  Absent UC:   regular, every 2 minutes SVE:   Dilation: 7 Effacement (%): 100 Station: -1(caput ) Exam by:: jaton burgess rnc   A / P:  28 y.o.  OB History  Gravida Para Term Preterm AB Living  1 0 0 0 0 0  SAB TAB Ectopic Multiple Live Births  0 0 0 0 0   at 110w4d Induction of labor for chronic hypertension.  Adequate progress.  Continue Pitocin.  Watch for descent given suspect LGA.  Blood pressure stable and without signs or symptoms of preeclampsia  Fetal Wellbeing:  Category I Pain Control:  Epidural  Anticipated MOD:  NSVD  [name] 12/28/2018, 6:13 PM

## 2018-12-28 NOTE — Progress Notes (Signed)
Saliyah ALEVIA HANAWAY is a 28 y.o. G1P0 at [redacted]w[redacted]d, admitted for IOL for well controlled GHTN, [facility] primary Dr Ernestina Penna  Subjective: Pitocin started 9.30 AM. On 2 units only since contracting well. 2 doses Nubain 10 mg overnight, requesting 1 more dose  Objective: BP 132/75   Pulse 81   Temp 97.7 F (36.5 C) (Oral)   Resp 14   Ht 5\' 6"  (1.676 m)   Wt 116.1 kg   BMI 41.32 kg/m   FHT:  120s now with minimal variability (since mom had Nubain) but overall prior to this mod variab, + accels, no decels- cat I UC:   regular, every 2 minutes, pitocin at 2 mu SVE:   Dilation: 2 Effacement (%): 100 Station: -3 Exam by:: Dr Juliene Pina  AROM, clear fluid, high station at inlet but not ballotable   Assessment / Plan: G1 39.4 weeks, IOL for GHTN, stable BPs. No antiHTN meds. Pitocin at 2 mu. FHT cat I  Nubain 3rd dose now, epidural after this as desired. Ambulate when possible.  LGA, watch descent   Robley Fries 12/28/2018, 12:02 PM

## 2018-12-28 NOTE — Progress Notes (Signed)
Risks and benefits of c/s discussed with patient by Ernestina Penna MD and Hyacinth Meeker MD. Signed consent at bedside.

## 2018-12-28 NOTE — Plan of Care (Signed)
  Problem: Education: Goal: Knowledge of Childbirth will improve Outcome: Progressing Goal: Ability to make informed decisions regarding treatment and plan of care will improve Outcome: Progressing Goal: Ability to state and carry out methods to decrease the pain will improve Outcome: Progressing   Problem: Coping: Goal: Ability to verbalize concerns and feelings about labor and delivery will improve Outcome: Progressing   Problem: Life Cycle: Goal: Ability to make normal progression through stages of labor will improve Outcome: Progressing   Problem: Pain Management: Goal: Relief or control of pain from uterine contractions will improve Outcome: Progressing   Problem: Health Behavior/Discharge Planning: Goal: Ability to manage health-related needs will improve Outcome: Progressing

## 2018-12-29 ENCOUNTER — Encounter (HOSPITAL_COMMUNITY): Payer: Self-pay | Admitting: *Deleted

## 2018-12-29 DIAGNOSIS — O9902 Anemia complicating childbirth: Secondary | ICD-10-CM | POA: Diagnosis present

## 2018-12-29 LAB — COMPREHENSIVE METABOLIC PANEL
ALT: 9 U/L (ref 0–44)
AST: 24 U/L (ref 15–41)
Albumin: 2.1 g/dL — ABNORMAL LOW (ref 3.5–5.0)
Alkaline Phosphatase: 88 U/L (ref 38–126)
Anion gap: 9 (ref 5–15)
BUN: 6 mg/dL (ref 6–20)
CO2: 22 mmol/L (ref 22–32)
Calcium: 8.4 mg/dL — ABNORMAL LOW (ref 8.9–10.3)
Chloride: 107 mmol/L (ref 98–111)
Creatinine, Ser: 0.9 mg/dL (ref 0.44–1.00)
GFR calc Af Amer: >60 mL/min (ref >60)
GFR calc non Af Amer: >60 mL/min (ref >60)
Glucose, Bld: 118 mg/dL — ABNORMAL HIGH (ref 70–99)
Potassium: 4.3 mmol/L (ref 3.5–5.1)
Sodium: 138 mmol/L (ref 135–145)
Total Bilirubin: 0.8 mg/dL (ref 0.3–1.2)
Total Protein: 5.4 g/dL — ABNORMAL LOW (ref 6.5–8.1)

## 2018-12-29 LAB — CBC
HCT: 27.9 % — ABNORMAL LOW (ref 36.0–46.0)
HCT: 26.8 % — ABNORMAL LOW (ref 36.0–46.0)
Hemoglobin: 9 g/dL — ABNORMAL LOW (ref 12.0–15.0)
Hemoglobin: 8.4 g/dL — ABNORMAL LOW (ref 12.0–15.0)
MCH: 27.8 pg (ref 26.0–34.0)
MCH: 26.8 pg (ref 26.0–34.0)
MCHC: 32.3 g/dL (ref 30.0–36.0)
MCHC: 31.3 g/dL (ref 30.0–36.0)
MCV: 86.1 fL (ref 80.0–100.0)
MCV: 85.4 fL (ref 80.0–100.0)
Platelets: 257 10*3/uL (ref 150–400)
Platelets: 257 10*3/uL (ref 150–400)
RBC: 3.24 MIL/uL — ABNORMAL LOW (ref 3.87–5.11)
RBC: 3.14 MIL/uL — ABNORMAL LOW (ref 3.87–5.11)
RDW: 13 % (ref 11.5–15.5)
RDW: 13.1 % (ref 11.5–15.5)
WBC: 31.5 10*3/uL — ABNORMAL HIGH (ref 4.0–10.5)
WBC: 26.3 10*3/uL — ABNORMAL HIGH (ref 4.0–10.5)
nRBC: 0 % (ref 0.0–0.2)
nRBC: 0 % (ref 0.0–0.2)

## 2018-12-29 LAB — ABO/RH: ABO/RH(D): B POS

## 2018-12-29 MED ORDER — SIMETHICONE 80 MG PO CHEW: 80.0000 mg | CHEWABLE_TABLET | ORAL | Status: DC | PRN

## 2018-12-29 MED ORDER — DIPHENHYDRAMINE HCL 50 MG/ML IJ SOLN: 12.5000 mg | INTRAMUSCULAR | Status: DC | PRN

## 2018-12-29 MED ORDER — PHENYLEPHRINE HCL (PRESSORS) 10 MG/ML IV SOLN
INTRAVENOUS | Status: DC | PRN
  Administered 2018-12-29 (×2): 80 ug via INTRAVENOUS

## 2018-12-29 MED ORDER — WITCH HAZEL-GLYCERIN EX PADS: 1.0000 "application " | MEDICATED_PAD | CUTANEOUS | Status: DC | PRN

## 2018-12-29 MED ORDER — LACTATED RINGERS IV SOLN
INTRAVENOUS | Status: DC | PRN
  Administered 2018-12-28 – 2018-12-29 (×2): via INTRAVENOUS

## 2018-12-29 MED ORDER — NALBUPHINE HCL 10 MG/ML IJ SOLN
5.0000 mg | INTRAMUSCULAR | Status: DC | PRN
  Administered 2018-12-29: 5 mg via SUBCUTANEOUS
  Filled 2018-12-29: qty 1

## 2018-12-29 MED ORDER — PHENYLEPHRINE 40 MCG/ML (10ML) SYRINGE FOR IV PUSH (FOR BLOOD PRESSURE SUPPORT)
PREFILLED_SYRINGE | INTRAVENOUS | Status: AC
  Filled 2018-12-29: qty 10

## 2018-12-29 MED ORDER — TETANUS-DIPHTH-ACELL PERTUSSIS 5-2.5-18.5 LF-MCG/0.5 IM SUSP: 0.5000 mL | Freq: Once | INTRAMUSCULAR | Status: DC

## 2018-12-29 MED ORDER — PANTOPRAZOLE SODIUM 40 MG PO TBEC
40.0000 mg | DELAYED_RELEASE_TABLET | Freq: Every day | ORAL | Status: DC
  Administered 2018-12-29 – 2018-12-31 (×3): 40 mg via ORAL
  Filled 2018-12-29 (×3): qty 1

## 2018-12-29 MED ORDER — LACTATED RINGERS IV SOLN
INTRAVENOUS | Status: DC
  Administered 2018-12-29: 09:00:00 via INTRAVENOUS

## 2018-12-29 MED ORDER — HYDROMORPHONE HCL 1 MG/ML IJ SOLN: 0.2500 mg | INTRAMUSCULAR | Status: DC | PRN

## 2018-12-29 MED ORDER — NALBUPHINE HCL 10 MG/ML IJ SOLN
5.0000 mg | Freq: Once | INTRAMUSCULAR | Status: AC | PRN
  Administered 2018-12-29: 5 mg via INTRAVENOUS
  Filled 2018-12-29: qty 1

## 2018-12-29 MED ORDER — MORPHINE SULFATE (PF) 0.5 MG/ML IJ SOLN
INTRAMUSCULAR | Status: AC
  Filled 2018-12-29: qty 10

## 2018-12-29 MED ORDER — PROMETHAZINE HCL 25 MG/ML IJ SOLN: 6.2500 mg | INTRAMUSCULAR | Status: DC | PRN

## 2018-12-29 MED ORDER — KETOROLAC TROMETHAMINE 30 MG/ML IJ SOLN
INTRAMUSCULAR | Status: AC
  Filled 2018-12-29: qty 1

## 2018-12-29 MED ORDER — NALBUPHINE HCL 10 MG/ML IJ SOLN: 5.0000 mg | Freq: Once | INTRAMUSCULAR | Status: AC | PRN

## 2018-12-29 MED ORDER — ZOLPIDEM TARTRATE 5 MG PO TABS: 5.0000 mg | ORAL_TABLET | Freq: Every evening | ORAL | Status: DC | PRN

## 2018-12-29 MED ORDER — SODIUM CHLORIDE 0.9 % IV SOLN
INTRAVENOUS | Status: DC | PRN
  Administered 2018-12-29: 40 [IU] via INTRAVENOUS

## 2018-12-29 MED ORDER — SENNOSIDES-DOCUSATE SODIUM 8.6-50 MG PO TABS
2.0000 | ORAL_TABLET | ORAL | Status: DC
  Administered 2018-12-29 – 2018-12-30 (×2): 2 via ORAL
  Filled 2018-12-29 (×2): qty 2

## 2018-12-29 MED ORDER — NALOXONE HCL 4 MG/10ML IJ SOLN
1.0000 ug/kg/h | INTRAVENOUS | Status: DC | PRN
  Filled 2018-12-29: qty 5

## 2018-12-29 MED ORDER — NALBUPHINE HCL 10 MG/ML IJ SOLN: 5.0000 mg | INTRAMUSCULAR | Status: DC | PRN

## 2018-12-29 MED ORDER — SODIUM CHLORIDE 0.9% FLUSH: 3.0000 mL | INTRAVENOUS | Status: DC | PRN

## 2018-12-29 MED ORDER — OXYTOCIN 40 UNITS IN NORMAL SALINE INFUSION - SIMPLE MED: 2.5000 [IU]/h | INTRAVENOUS | Status: AC

## 2018-12-29 MED ORDER — MEPERIDINE HCL 25 MG/ML IJ SOLN
INTRAMUSCULAR | Status: DC | PRN
  Administered 2018-12-29 (×2): 12.5 mg via INTRAVENOUS

## 2018-12-29 MED ORDER — MEPERIDINE HCL 25 MG/ML IJ SOLN: 6.2500 mg | INTRAMUSCULAR | Status: DC | PRN

## 2018-12-29 MED ORDER — SODIUM CHLORIDE 0.9 % IV SOLN
510.0000 mg | Freq: Once | INTRAVENOUS | Status: AC
  Administered 2018-12-29: 510 mg via INTRAVENOUS
  Filled 2018-12-29: qty 17

## 2018-12-29 MED ORDER — MEPERIDINE HCL 25 MG/ML IJ SOLN
INTRAMUSCULAR | Status: AC
  Filled 2018-12-29: qty 1

## 2018-12-29 MED ORDER — KETOROLAC TROMETHAMINE 30 MG/ML IJ SOLN
30.0000 mg | Freq: Once | INTRAMUSCULAR | Status: AC | PRN
  Administered 2018-12-29: 30 mg via INTRAVENOUS

## 2018-12-29 MED ORDER — DIPHENHYDRAMINE HCL 25 MG PO CAPS
25.0000 mg | ORAL_CAPSULE | ORAL | Status: DC | PRN
  Administered 2018-12-29: 25 mg via ORAL
  Filled 2018-12-29 (×2): qty 1

## 2018-12-29 MED ORDER — DEXAMETHASONE SODIUM PHOSPHATE 4 MG/ML IJ SOLN
INTRAMUSCULAR | Status: DC | PRN
  Administered 2018-12-29: 4 mg via INTRAVENOUS

## 2018-12-29 MED ORDER — SCOPOLAMINE 1 MG/3DAYS TD PT72
MEDICATED_PATCH | TRANSDERMAL | Status: DC | PRN
  Administered 2018-12-29: 1 via TRANSDERMAL

## 2018-12-29 MED ORDER — SIMETHICONE 80 MG PO CHEW
80.0000 mg | CHEWABLE_TABLET | Freq: Three times a day (TID) | ORAL | Status: DC
  Administered 2018-12-29 – 2018-12-30 (×6): 80 mg via ORAL
  Filled 2018-12-29 (×7): qty 1

## 2018-12-29 MED ORDER — IBUPROFEN 800 MG PO TABS
800.0000 mg | ORAL_TABLET | Freq: Three times a day (TID) | ORAL | Status: DC
  Administered 2018-12-29 – 2018-12-31 (×6): 800 mg via ORAL
  Filled 2018-12-29 (×7): qty 1

## 2018-12-29 MED ORDER — CEFAZOLIN SODIUM-DEXTROSE 2-4 GM/100ML-% IV SOLN
INTRAVENOUS | Status: AC
  Filled 2018-12-29: qty 100

## 2018-12-29 MED ORDER — ONDANSETRON HCL 4 MG/2ML IJ SOLN: 4.0000 mg | Freq: Three times a day (TID) | INTRAMUSCULAR | Status: DC | PRN

## 2018-12-29 MED ORDER — COCONUT OIL OIL: 1.0000 "application " | TOPICAL_OIL | Status: DC | PRN

## 2018-12-29 MED ORDER — POLYSACCHARIDE IRON COMPLEX 150 MG PO CAPS
150.0000 mg | ORAL_CAPSULE | Freq: Every day | ORAL | Status: DC
  Administered 2018-12-30 – 2018-12-31 (×2): 150 mg via ORAL
  Filled 2018-12-29 (×2): qty 1

## 2018-12-29 MED ORDER — SCOPOLAMINE 1 MG/3DAYS TD PT72: 1.0000 | MEDICATED_PATCH | Freq: Once | TRANSDERMAL | Status: DC

## 2018-12-29 MED ORDER — NALOXONE HCL 0.4 MG/ML IJ SOLN: 0.4000 mg | INTRAMUSCULAR | Status: DC | PRN

## 2018-12-29 MED ORDER — ONDANSETRON HCL 4 MG/2ML IJ SOLN
INTRAMUSCULAR | Status: DC | PRN
  Administered 2018-12-29: 4 mg via INTRAVENOUS

## 2018-12-29 MED ORDER — LIDOCAINE-EPINEPHRINE (PF) 2 %-1:200000 IJ SOLN
INTRAMUSCULAR | Status: DC | PRN
  Administered 2018-12-28 – 2018-12-29 (×2): 5 mL via EPIDURAL

## 2018-12-29 MED ORDER — CEFAZOLIN SODIUM-DEXTROSE 2-3 GM-%(50ML) IV SOLR
INTRAVENOUS | Status: DC | PRN
  Administered 2018-12-29: 2 g via INTRAVENOUS

## 2018-12-29 MED ORDER — OXYCODONE-ACETAMINOPHEN 5-325 MG PO TABS
1.0000 | ORAL_TABLET | ORAL | Status: DC | PRN
  Administered 2018-12-30: 1 via ORAL
  Administered 2018-12-31: 10:00:00 2 via ORAL
  Filled 2018-12-29: qty 2
  Filled 2018-12-29: qty 1

## 2018-12-29 MED ORDER — PRENATAL MULTIVITAMIN CH
1.0000 | ORAL_TABLET | Freq: Every day | ORAL | Status: DC
  Administered 2018-12-29 – 2018-12-30 (×2): 1 via ORAL
  Filled 2018-12-29 (×3): qty 1

## 2018-12-29 MED ORDER — OXYCODONE HCL 5 MG/5ML PO SOLN: 5.0000 mg | Freq: Once | ORAL | Status: DC | PRN

## 2018-12-29 MED ORDER — OXYCODONE HCL 5 MG PO TABS: 5.0000 mg | ORAL_TABLET | Freq: Once | ORAL | Status: DC | PRN

## 2018-12-29 MED ORDER — MENTHOL 3 MG MT LOZG: 1.0000 | LOZENGE | OROMUCOSAL | Status: DC | PRN

## 2018-12-29 MED ORDER — MAGNESIUM OXIDE 400 (241.3 MG) MG PO TABS
400.0000 mg | ORAL_TABLET | Freq: Every day | ORAL | Status: DC
  Administered 2018-12-29 – 2018-12-31 (×3): 400 mg via ORAL
  Filled 2018-12-29 (×3): qty 1

## 2018-12-29 MED ORDER — DIBUCAINE (PERIANAL) 1 % EX OINT: 1.0000 "application " | TOPICAL_OINTMENT | CUTANEOUS | Status: DC | PRN

## 2018-12-29 MED ORDER — MORPHINE SULFATE (PF) 0.5 MG/ML IJ SOLN
INTRAMUSCULAR | Status: DC | PRN
  Administered 2018-12-29: 3 mg via EPIDURAL

## 2018-12-29 MED ORDER — SODIUM CHLORIDE 0.9 % IV SOLN
INTRAVENOUS | Status: DC | PRN
  Administered 2018-12-29: via INTRAVENOUS

## 2018-12-29 MED ORDER — DIPHENHYDRAMINE HCL 25 MG PO CAPS
25.0000 mg | ORAL_CAPSULE | Freq: Four times a day (QID) | ORAL | Status: DC | PRN
  Administered 2018-12-29: 25 mg via ORAL

## 2018-12-29 MED ORDER — SIMETHICONE 80 MG PO CHEW
80.0000 mg | CHEWABLE_TABLET | ORAL | Status: DC
  Administered 2018-12-29 – 2018-12-30 (×2): 80 mg via ORAL
  Filled 2018-12-29 (×2): qty 1

## 2018-12-29 NOTE — Anesthesia Postprocedure Evaluation (Signed)
Anesthesia Post Note  Patient: [name]  Procedure(s) Performed: CESAREAN SECTION (N/A )     Patient location during evaluation: Mother Baby Anesthesia Type: Epidural Level of consciousness: awake and alert Pain management: pain level controlled Vital Signs Assessment: post-procedure vital signs reviewed and stable Respiratory status: spontaneous breathing, nonlabored ventilation and respiratory function stable Cardiovascular status: stable Postop Assessment: no headache, no backache and epidural receding Anesthetic complications: no    Last Vitals:  Vitals:   12/29/18 0500 12/29/18 0600  BP: 132/79 (!) 145/80  Pulse: 78 89  Resp: 20 20  Temp: 36.9 C 37.2 C  SpO2: 98% 98%    Last Pain:  Vitals:   12/29/18 0700  TempSrc:   PainSc: Asleep   Pain Goal: Patients Stated Pain Goal: 4 (12/28/18 2100)                 Marissa Blake

## 2018-12-29 NOTE — Addendum Note (Signed)
Addendum  created 12/29/18 3953 by Junious Silk, CRNA   Clinical Note Signed

## 2018-12-29 NOTE — Anesthesia Postprocedure Evaluation (Signed)
Anesthesia Post Note  Patient: Marissa Blake  Procedure(s) Performed: CESAREAN SECTION (N/A )     Patient location during evaluation: Mother Baby Anesthesia Type: Epidural Level of consciousness: awake and alert Pain management: pain level controlled Vital Signs Assessment: post-procedure vital signs reviewed and stable Respiratory status: spontaneous breathing, nonlabored ventilation and respiratory function stable Cardiovascular status: stable Postop Assessment: no headache, no backache and epidural receding Anesthetic complications: no    Last Vitals:  Vitals:   12/29/18 0245 12/29/18 0300  BP: 118/80 (!) 119/51  Pulse: 88 92  Resp: (!) 21 20  Temp: 36.9 C 36.9 C  SpO2: 97% 99%    Last Pain:  Vitals:   12/29/18 0300  TempSrc:   PainSc: 0-No pain   Pain Goal: Patients Stated Pain Goal: 4 (12/28/18 2100)                 Lowella Curb

## 2018-12-29 NOTE — Progress Notes (Signed)
Patient ID: Marissa Blake, female   DOB: [birth year], 28 y.o.   MRN: [id number] Subjective: POD# 0 Live born female  Birth Weight: 8 lb 6.8 oz (3820 g) APGAR: 9, 9  Newborn Delivery   Birth date/time:  12/29/2018 00:25:00 Delivery type:  C-Section, Low Transverse Trial of labor:  Yes C-section categorization:  Primary    Delivering provider: Noland Fordyce   circumcision completed Feeding: breast  Pain control at delivery: Epidural   Reports feeling well.  Patient reports tolerating PO.   Breast symptoms: no pain with latch Pain controlled with PO meds Denies HA/SOB/C/P/N/V/dizziness. Flatus absent. She reports vaginal bleeding as normal, without clots.  She is ambulating, foley cath in place, clear yellow urine. Objective:   VS:    Vitals:   12/29/18 0300 12/29/18 0400 12/29/18 0500 12/29/18 0600  BP: (!) 119/51 126/78 132/79 (!) 145/80  Pulse: 92 78 78 89  Resp: 20 20 20 20   Temp: 98.4 F (36.9 C) 98.8 F (37.1 C) 98.4 F (36.9 C) 99 F (37.2 C)  TempSrc:      SpO2: 99% 97% 98% 98%  Weight:      Height:          Intake/Output Summary (Last 24 hours) at 12/29/2018 0905 Last data filed at 12/29/2018 0600 Gross per 24 hour  Intake 5998.6 ml  Output 2750 ml  Net 3248.6 ml        Recent Labs    12/29/18 0202 12/29/18 0527  WBC 31.5* 26.3*  HGB 9.0* 8.4*  HCT 27.9* 26.8*  PLT 257 257     Blood type: --/--/B POS, B POS Performed at [facility], [address], [place], [place] [address]  [phone])  Rubella: Immune (09/27 0000)  Vaccines: TDaP UTD         Flu    UTD   Physical Exam:  General: alert, cooperative and no distress CV: Regular rate and rhythm Resp: clear Abdomen: soft, nontender, normal bowel sounds Incision: clean, dry and intact Uterine Fundus: firm, below umbilicus, nontender Lochia: minimal Ext: extremities normal, atraumatic, no cyanosis or edema      Assessment/Plan: 28 y.o.   POD# 1. G1P1001                   Principal Problem:   Postpartum care following cesarean delivery (4/21) Active Problems:   Encounter for induction of labor   Arrested active phase of labor   Cesarean delivery    Maternal anemia, with delivery  - Ferriheme ordered, will start oral fe and mag ox tomorrow   Doing well, stable.               Advance diet as tolerated Encourage rest when baby rests Breastfeeding support Encourage to ambulate after foley dc'ed Routine post-op care  Neta Mends, CNM, MSN 12/29/2018, 9:05 AM

## 2018-12-29 NOTE — Brief Op Note (Signed)
12/28/2018 - 12/29/2018  1:14 AM  PATIENT:  Marissa Blake  28 y.o. female  PRE-OPERATIVE DIAGNOSIS:  arrest of dilation, chronic hypertension, failed induction of labor  POST-OPERATIVE DIAGNOSIS:   arrest of dilation, chronic hypertension, failed induction of labor  PROCEDURE:  Procedure(s): CESAREAN SECTION (N/A)  Primary low transverse cesarean section with 2 layer closure  SURGEON:  Surgeon(s) and Role:    * Noland Fordyce, MD - Primary  PHYSICIAN ASSISTANT:   ASSISTANTS: Marlinda Mike, CNM  ANESTHESIA:   epidural  EBL: Per anesthesia notes, per my estimate about 1000 cc  BLOOD ADMINISTERED:none  DRAINS: Urinary Catheter (Foley)   LOCAL MEDICATIONS USED:  NONE  SPECIMEN:  Source of Specimen:  Placenta  DISPOSITION OF SPECIMEN:  Labor and delivery  COUNTS:  YES  TOURNIQUET:  * No tourniquets in log *  DICTATION: .Note written in EPIC  PLAN OF CARE: Admit to inpatient   PATIENT DISPOSITION:  PACU - hemodynamically stable.   Delay start of Pharmacological VTE agent (>24hrs) due to surgical blood loss or risk of bleeding: yes

## 2018-12-29 NOTE — Op Note (Signed)
12/28/2018 - 12/29/2018  1:14 AM  PATIENT:  Marissa Blake  28 y.o. female  PRE-OPERATIVE DIAGNOSIS:  arrest of dilation, chronic hypertension, failed induction of labor  POST-OPERATIVE DIAGNOSIS:   arrest of dilation, chronic hypertension, failed induction of labor  PROCEDURE:  Procedure(s): CESAREAN SECTION (N/A)  Primary low transverse cesarean section with 2 layer closure  SURGEON:  Surgeon(s) and Role:    * Noland FordyceFogleman, Shine Scrogham, MD - Primary  PHYSICIAN ASSISTANT:   ASSISTANTS: Marlinda Mikeanya Bailey, CNM  ANESTHESIA:   epidural  EBL: Per anesthesia notes, per my estimate about 1000 cc  BLOOD ADMINISTERED:none  DRAINS: Urinary Catheter (Foley)   LOCAL MEDICATIONS USED:  NONE  SPECIMEN:  Source of Specimen:  Placenta  DISPOSITION OF SPECIMEN:  Labor and delivery  COUNTS:  YES  TOURNIQUET:  * No tourniquets in log *  DICTATION: .Note written in EPIC  PLAN OF CARE: Admit to inpatient   PATIENT DISPOSITION:  PACU - hemodynamically stable.   Delay start of Pharmacological VTE agent (>24hrs) due to surgical blood loss or risk of bleeding: yes    Findings:  @BABYSEXEBC @ infant,  APGAR (1 MIN): 9   APGAR (5 MINS): 9   APGAR (10 MINS):   Normal uterus, tubes and ovaries, normal placenta. 3VC, clear amniotic fluid, OP position  EBL: 1000 cc Antibiotics:   2g Ancef Complications: none  Indications: This is a 28 y.o. year-old, G1 at 8260w5d admitted for induction of labor due to chronic hypertension.  Patient had adequate induction of labor with achievement of active labor.  Patient dilated to 5 to 6 cm after which time she went 6 hours with no further cervical change despite adequate [place] units by [facility].  In addition increasing Cervical Swelling, capit, and molding were noted with the fetal head at the 0 station.. Risks benefits and alternatives of the procedure were discussed with the patient who agreed to proceed  Procedure:  After informed consent was obtained the patient was  taken to the operating room where epidural anesthesia was found to be adequate.  She was prepped and draped in the normal sterile fashion in dorsal supine position with a leftward tilt.  A foley catheter was in place.  A Pfannenstiel skin incision was made 2 cm above the pubic symphysis in the midline with the scalpel.  Dissection was carried down with the Bovie cautery until the fascia was reached. The fascia was incised in the midline. The incision was extended laterally with the Mayo scissors. The inferior aspect of the fascial incision was grasped with the Coker clamps, elevated up and the underlying rectus muscles were dissected off sharply. The superior aspect of the fascial incision was grasped with the Coker clamps elevated up and the underlying rectus muscles were dissected off sharply.  The peritoneum was entered bluntly. The peritoneal incision was extended superiorly and inferiorly with good visualization of the bladder. The bladder blade was inserted and palpation was done to assess the fetal position and the location of the uterine vessels.  The head appeared low in the pelvis and the labor nurse placed her hand into the vagina and elevated the head.  The lower segment of the uterus was incised sharply with the scalpel and extended  bluntly in the cephalo-caudal fashion. The infant was grasped, brought to the incision,  rotated and the infant was delivered with fundal pressure. The cord was clamped and cut after 1 minute delay. The infant was handed off to the waiting pediatrician. The placenta was expressed.  The uterus was exteriorized. The uterus was cleared of all clots and debris.  Very brisk bleeding was noted from a large artery at the left angle, suspect uterine branch.  2 figure-of-eight sutures were thrown to control hemostasis.  Beginning at the left angle, the uterine incision was repaired with 0 Vicryl in a running locked fashion.  A second layer of the same suture was used in an  imbricating fashion to obtain excellent hemostasis.  The uterus was then returned to the abdomen, the gutters were cleared of all clots and debris. The uterine incision was reinspected and found to be hemostatic. The peritoneum was grasped and closed with 2-0 Vicryl in a running fashion. The cut muscle edges and the underside of the fascia were inspected and found to be hemostatic. The fascia was closed with 0 Vicryl in 2 halves. The subcutaneous tissue was irrigated. Scarpa's layer was closed with a 2-0 plain gut suture. The skin was closed with a 4-0 Monocryl in a single layer. The patient tolerated the procedure well. Sponge lap and needle counts were correct x3 and patient was taken to the recovery room in a stable condition.  [name] 12/29/2018 1:17 AM

## 2018-12-29 NOTE — Transfer of Care (Signed)
Immediate Anesthesia Transfer of Care Note  Patient: Marissa Blake  Procedure(s) Performed: CESAREAN SECTION (N/A )  Patient Location: PACU  Anesthesia Type:Epidural  Level of Consciousness: awake, alert  and patient cooperative  Airway & Oxygen Therapy: Patient Spontanous Breathing  Post-op Assessment: Report given to RN, Post -op Vital signs reviewed and stable and Patient moving all extremities X 4  Post vital signs: Reviewed and stable  Last Vitals:  Vitals Value Taken Time  BP 127/106 12/29/2018  1:24 AM  Temp    Pulse 109 12/29/2018  1:29 AM  Resp 21 12/29/2018  1:29 AM  SpO2 100 % 12/29/2018  1:29 AM  Vitals shown include unvalidated device data.  Last Pain:  Vitals:   12/28/18 2330  TempSrc:   PainSc: Asleep      Patients Stated Pain Goal: 4 (12/28/18 2100)  Complications: No apparent anesthesia complications

## 2018-12-30 NOTE — Lactation Note (Signed)
This note was copied from a baby's chart. Lactation Consultation Note  Patient Name: Marissa Blake [id number] Date: 12/30/2018 Reason for consult: Initial assessment;1st time breastfeeding;Term P1, 25 hour female infant. Infant had 3 stools and 3 voids since delivery. Mom has DEBP at home. Per mom, infant did not latch in L&D but has latched well since admitted on 4th floor. Per mom, she feels breastfeeding is going well infant is latching without difficulties. LC did not observe latch. Per mom, infant latched prior to [facility] entering the room for 20 minutes. LC discussed hand expression and mom taught back  hand expression and infant was given 1 ml of colostrum by spoon. LC did not see any trauma to breast when mom was doing hand expression mom's breast are everted and well rounded. Mom knows to breastfeed according to hunger cues, 8 or more times within 24 hours. LC discussed I & O. LC discussed cluster feeding after 24 hours with parents.  Mom knows to call Nurse or LC if she has any questions, concerns or need assistance with latching infant to breast. Reviewed Baby & Me book's Breastfeeding Basics.  Mom made aware of O/P services, breastfeeding support groups, community resources, and our phone # for post-discharge questions.  Maternal Data Formula Feeding for Exclusion: No Has patient been taught Hand Expression?: Yes(Mom demonstated hand expression infant given 1 ml of colostrum by spoon.) Does the patient have breastfeeding experience prior to this delivery?: No  Feeding Feeding Type: Breast Fed  LATCH Score                   Interventions Interventions: Breast feeding basics reviewed;Skin to skin;Hand express;Position options  Lactation Tools Discussed/Used WIC Program: No   Consult Status Consult Status: Follow-up Date: 12/31/18 Follow-up type: In-patient    [name] 12/30/2018, 1:30 AM

## 2018-12-30 NOTE — Lactation Note (Signed)
This note was copied from a baby's chart. Lactation Consultation Note  Patient Name: Marissa Blake [id number] Date: 12/30/2018 Reason for consult: Follow-up assessment;Term;Primapara Baby is 34 hours old/7% weight loss.  Mom reports that baby is cluster feeding.  Baby is latching with ease and mom comfortable.  Instructed on breast massage during feeding to increase volume to baby.  Instructed to feed with cues and call for assist prn.  Maternal Data    Feeding Feeding Type: Breast Fed  LATCH Score Latch: Grasps breast easily, tongue down, lips flanged, rhythmical sucking.  Audible Swallowing: A few with stimulation  Type of Nipple: Everted at rest and after stimulation  Comfort (Breast/Nipple): Soft / non-tender  Hold (Positioning): Assistance needed to correctly position infant at breast and maintain latch.  LATCH Score: 8  Interventions    Lactation Tools Discussed/Used     Consult Status Consult Status: Follow-up Date: 12/31/18 Follow-up type: In-patient    Huston Foley 12/30/2018, 10:44 AM

## 2018-12-30 NOTE — Progress Notes (Signed)
POSTOPERATIVE DAY # 1 S/P Primary LTCS for arrest of dilation, baby boy "Jam"   S:         Reports feeling sore and tired; baby was cluster feeding all night and states she didn't get any rest             Tolerating po intake / no nausea / no vomiting / no flatus / no BM  Denies dizziness, SOB, or CP             Bleeding is light             Pain not as controlled with Motrin; planning to ask for Percocet              Up ad lib / ambulatory 3 laps per day/ voiding QS  Newborn breast feeding- reports latching is going well / Circumcision - completed    O:  VS: BP 116/76 (BP Location: Left Arm)   Pulse 75   Temp 97.8 F (36.6 C) (Oral)   Resp 18   Ht  (1.676 m)   Wt 116.1 kg   SpO2 98%   Breastfeeding Unknown   BMI 41.32 kg/m  12/30/18 0548  97.8 F (36.6 C)  75  -  18  116/76  Semi-fowlers  -  -  - GZ   12/29/18 2300  98.2 F (36.8 C)  68  -  18  113/68  Semi-fowlers  -  -  - GZ   12/29/18 1956  99.6 F (37.6 C)  86  -  18  115/70  Semi-fowlers  -  -  - GZ   12/29/18 1530  -  95  -  16  110/55Abnormal   High-fowlers  98 %  -  - LJ   12/29/18 1230  98.6 F (37 C)  82  -  18  116/62  -  96 %  -  - EB   12/29/18 1100  -  -  -  -  -  -  97 %  -  - EB   12/29/18 0835  98.2 F (36.8 C)  92  -  18  128/69  -  96 %  -  - EB   12/29/18 0600  99 F (37.2 C)  89  -  20  145/80Abnormal   -  98 %  -  - LS   12/29/18 0500  98.4 F (36.9 C)  78  -  20  132/79  -  98 %  -  - LS   12/29/18 0400  98.8 F (37.1 C)  78  -  20  126/78  -  97 %  -  - LS   12/29/18 0300  98.4 F (36.9 C)  92  -  20  119/51Abnormal   -  99 %  -  - LS   12/29/18 0245  98.5 F (36.9 C)  88  91  21Abnormal   118/80  -  97 %  Room Air  - MS     LABS:               Results for orders placed or performed during the hospital encounter of 12/28/18 (from the past 48 hour(s))  CBC     Status: Abnormal   Collection Time: 12/28/18 12:50 PM  Result Value Ref Range   WBC 16.9 (H) 4.0 - 10.5 K/uL   RBC 4.09 3.87 -  5.11 MIL/uL   Hemoglobin  11.1 (L) 12.0 - 15.0 g/dL   HCT 40.934.7 (L) 81.136.0 - 91.446.0 %   MCV 84.8 80.0 - 100.0 fL   MCH 27.1 26.0 - 34.0 pg   MCHC 32.0 30.0 - 36.0 g/dL   RDW 78.213.2 95.611.5 - 21.315.5 %   Platelets 328 150 - 400 K/uL   nRBC 0.0 0.0 - 0.2 %    Comment: Performed at [facility], [address] [place], [place] [address]  CBC     Status: Abnormal   Collection Time: 12/29/18  2:02 AM  Result Value Ref Range   WBC 31.5 (H) 4.0 - 10.5 K/uL   RBC 3.24 (L) 3.87 - 5.11 MIL/uL   Hemoglobin 9.0 (L) 12.0 - 15.0 g/dL   HCT 84.627.9 (L) 96.236.0 - 95.246.0 %   MCV 86.1 80.0 - 100.0 fL   MCH 27.8 26.0 - 34.0 pg   MCHC 32.3 30.0 - 36.0 g/dL   RDW 84.113.0 32.411.5 - 40.115.5 %   Platelets 257 150 - 400 K/uL   nRBC 0.0 0.0 - 0.2 %    Comment: Performed at [facility], [address] [place], [place] [address]  CBC     Status: Abnormal   Collection Time: 12/29/18  5:27 AM  Result Value Ref Range   WBC 26.3 (H) 4.0 - 10.5 K/uL   RBC 3.14 (L) 3.87 - 5.11 MIL/uL   Hemoglobin 8.4 (L) 12.0 - 15.0 g/dL   HCT 36.626.8 (L) 44.036.0 - 34.746.0 %   MCV 85.4 80.0 - 100.0 fL   MCH 26.8 26.0 - 34.0 pg   MCHC 31.3 30.0 - 36.0 g/dL   RDW 42.513.1 95.611.5 - 38.715.5 %   Platelets 257 150 - 400 K/uL   nRBC 0.0 0.0 - 0.2 %    Comment: Performed at [facility], [address] [place], [place] [address]  Comprehensive metabolic panel     Status: Abnormal   Collection Time: 12/29/18  5:27 AM  Result Value Ref Range   Sodium 138 135 - 145 mmol/L   Potassium 4.3 3.5 - 5.1 mmol/L   Chloride 107 98 - 111 mmol/L   CO2 22 22 - 32 mmol/L   Glucose, Bld 118 (H) 70 - 99 mg/dL   BUN 6 6 - 20 mg/dL   Creatinine, Ser 2.950.90 0.44 - 1.00 mg/dL   Calcium 8.4 (L) 8.9 - 10.3 mg/dL   Total Protein 5.4 (L) 6.5 - 8.1 g/dL   Albumin 2.1 (L) 3.5 - 5.0 g/dL   AST 24 15 - 41 U/L   ALT 9 0 - 44 U/L   Alkaline Phosphatase 88 38 - 126 U/L   Total Bilirubin 0.8 0.3 - 1.2 mg/dL   GFR calc non Af Amer >60 >60 mL/min   GFR calc Af Amer >60 >60 mL/min    Anion gap 9 5 - 15    Comment: Performed at [facility], [address] [place], [place] [address]              Bloodtype: --/--/B POS, B POS (04/20 0016)  Rubella: Immune (09/27 0000)                                             I&O: Intake/Output      04/21 0701 - 04/22 0700 04/22 0701 - 04/23 0700  P.O. 1020    I.V. (mL/kg) 250 (2.2)    Total Intake(mL/kg) 1270 (10.9)    Urine (mL/kg/hr) 1800 (0.6)    Blood     Total Output 1800    Net -530                      Physical Exam:             Alert and Oriented X3  Lungs: Clear and unlabored  Heart: regular rate and rhythm / no murmurs  Abdomen: soft, non-tender, non-distended, active bowel sounds in all quadrants             Fundus: firm, non-tender, U-2             Dressing: honeycomb dsg with steri-strips c/d/i              Incision:  approximated with sutures / no erythema / no ecchymosis / no drainage  Perineum: intact  Lochia: scant, no clots   Extremities: no edema, no calf pain or tenderness,   A:        POD # 1 S/P Primary LTCS            Chronic Hypertension - not on meds; BPs stable  ABL Anemia - asymptomatic, s/p IV Feraheme, doing well on PO iron   GERD - on Protonix   P:        Routine postoperative care              Encouraged to rest when baby rests  Warm liquids to promote bowel motility   Continue working with lactation   Desires early discharge home tomorrow  Carlean Jews, MSN, CNM [facility]

## 2018-12-31 MED ORDER — OXYCODONE-ACETAMINOPHEN 5-325 MG PO TABS: 1.0000 | ORAL_TABLET | Freq: Four times a day (QID) | ORAL | 0 refills | Status: AC | PRN

## 2018-12-31 MED ORDER — HYDROCHLOROTHIAZIDE 12.5 MG PO TABS: 12.5000 mg | ORAL_TABLET | Freq: Every day | ORAL | 0 refills | Status: DC

## 2018-12-31 MED ORDER — IBUPROFEN 800 MG PO TABS: 800.0000 mg | ORAL_TABLET | Freq: Three times a day (TID) | ORAL | 0 refills | Status: DC

## 2018-12-31 MED ORDER — MAGNESIUM OXIDE 400 (241.3 MG) MG PO TABS: 400.0000 mg | ORAL_TABLET | Freq: Every day | ORAL | 0 refills | Status: DC

## 2018-12-31 MED ORDER — POLYSACCHARIDE IRON COMPLEX 150 MG PO CAPS: 150.0000 mg | ORAL_CAPSULE | Freq: Every day | ORAL | 0 refills | Status: DC

## 2018-12-31 NOTE — Discharge Summary (Signed)
OB Discharge Summary  Patient Name: Marissa Blake DOB: 08-11-1991 MRN: [id number]  Date of admission: 12/28/2018 Admitting diagnosis: pregnancy Intrauterine pregnancy: [redacted]w[redacted]d     Secondary diagnosis: Chronic Hypertension   Date of discharge: 12/31/2018    Discharge diagnosis: primary Cesarean delivery, POD #2 Term Pregnancy Delivered, CHTN and Anemia      Prenatal history: G1P1001   EDC : 12/31/2018, by Other Basis  Prenatal care at [facility]  Primary provider : Ernestina Penna Prenatal course complicated by chronic hypertension / obesity  Prenatal Labs: ABO, Rh: --/--/B POS, B POS (04/20 0016) Antibody: NEG (04/20 0016) Rubella: Immune (09/27 0000)  RPR: Non Reactive (04/20 0016)  HBsAg: Negative (09/27 0000)  HIV: Non-reactive (09/27 0000)  GBS: Negative (03/26 0000)                                    Hospital course:  Induction of Labor With Cesarean Section  28 y.o. yo G1P1001 at [redacted]w[redacted]d was admitted to the hospital 12/28/2018 for induction of labor due to chronic hypertension (no medication control). Patient had a labor course significant for successful labor induction with cytotec and pitocin. Arrest of active phase of labor at 7 cm without decent. The patient went for cesarean section due to Arrest of Dilation and Arrest of Descent, and delivered a Viable infant,12/29/2018  Membrane Rupture Time/Date: 11:50 AM ,12/28/2018  Details of operation can be found in separate operative Note.    Patient had an uncomplicated postpartum course. She is ambulating, tolerating a regular diet, passing flatus, and urinating well.  Patient is discharged home in stable condition on 12/31/18 on POD 2.                                    Delivering PROVIDER: Noland Fordyce                                                            Complications: None  Newborn Data: Live born female  Birth Weight: 8 lb 6.8 oz (3820 g) APGAR: 9, 9  Newborn Delivery   Birth date/time:  12/29/2018  00:25:00 Delivery type:  C-Section, Low Transverse Trial of labor:  Yes C-section categorization:  Primary     Baby Feeding: Breast Disposition:home with mother  Post partum procedures:iron infusion   Labs: Lab Results  Component Value Date   WBC 26.3 (H) 12/29/2018   HGB 8.4 (L) 12/29/2018   HCT 26.8 (L) 12/29/2018   MCV 85.4 12/29/2018   PLT 257 12/29/2018   CMP Latest Ref Rng & Units 12/29/2018  Glucose 70 - 99 mg/dL 638(G)  BUN 6 - 20 mg/dL 6  Creatinine 5.36 - 4.68 mg/dL 0.32  Sodium 122 - 482 mmol/L 138  Potassium 3.5 - 5.1 mmol/L 4.3  Chloride 98 - 111 mmol/L 107  CO2 22 - 32 mmol/L 22  Calcium 8.9 - 10.3 mg/dL 5.0(I)  Total Protein 6.5 - 8.1 g/dL 3.7(C)  Total Bilirubin 0.3 - 1.2 mg/dL 0.8  Alkaline Phos 38 - 126 U/L 88  AST 15 - 41 U/L 24  ALT 0 - 44 U/L 9  Physical Exam @ time of discharge:  Vitals:   12/30/18 0548 12/30/18 1442 12/30/18 2240 12/31/18 0601  BP: 116/76 (!) 123/94 119/72 124/87  Pulse: 75 70 79 70  Resp: 18 18 18 18   Temp: 97.8 F (36.6 C) 99.1 F (37.3 C) 98.4 F (36.9 C) 98.7 F (37.1 C)  TempSrc: Oral Oral Oral Oral  SpO2:    100%  Weight:      Height:        General: alert, cooperative and no distress Lochia: appropriate Uterine Fundus: firm Incision: Healing well with no significant drainage Extremities: ankle and hand edema 1+ with dependent thigh edema DVT Evaluation: No evidence of DVT seen on physical exam.   Discharge instructions:  "Baby and Me Booklet" and [facility] Booklet  Discharge Medications:  Allergies as of 12/31/2018      Reactions   Benzoyl Peroxide Other (See Comments)   redness   Latex Other (See Comments)   Rash/irritation      Medication List    STOP taking these medications   omeprazole 20 MG capsule Commonly known as:  PRILOSEC     TAKE these medications   aspirin EC 81 MG tablet Take 81 mg by mouth daily.   hydrochlorothiazide 12.5 MG tablet Commonly known as:   HYDRODIURIL Take 1 tablet (12.5 mg total) by mouth daily.   ibuprofen 800 MG tablet Commonly known as:  ADVIL Take 1 tablet (800 mg total) by mouth every 8 (eight) hours.   iron polysaccharides 150 MG capsule Commonly known as:  NIFEREX Take 1 capsule (150 mg total) by mouth daily.   magnesium oxide 400 (241.3 Mg) MG tablet Commonly known as:  MAG-OX Take 1 tablet (400 mg total) by mouth daily.   oxyCODONE-acetaminophen 5-325 MG tablet Commonly known as:  PERCOCET/ROXICET Take 1 tablet by mouth every 6 (six) hours as needed for up to 5 days for moderate pain.   pantoprazole 20 MG tablet Commonly known as:  PROTONIX Take 20 mg by mouth every morning.   prenatal multivitamin Tabs tablet Take 1 tablet by mouth daily at 12 noon.   Vitamin D (Ergocalciferol) 1.25 MG (50000 UT) Caps capsule Commonly known as:  DRISDOL Take 1 capsule (50,000 Units total) by mouth every 7 (seven) days.            Discharge Care Instructions  (From admission, onward)         Start     Ordered   12/31/18 0000  Discharge wound care:    Comments:  Leave honeycomb in place for 5 days - remove if get wet in shower. Leave steri-strips in place x 2 weeks. Keep incision clean and dry   12/31/18 0955          Diet: routine diet with reduced sodium / increase water  Activity: Advance as tolerated. Pelvic rest x 6 weeks.   Follow up:6 weeks    Signed: Marlinda Mikeanya Milus Fritze CNM, MSN, [facility] 12/31/2018, 9:56 AM

## 2018-12-31 NOTE — Lactation Note (Signed)
This note was copied from a baby's chart. Lactation Consultation Note  Patient Name: Marissa Blake [id number] Date: 12/31/2018 Reason for consult: Follow-up assessment;Term;Primapara;Infant weight loss Baby is 56 hours old/11% weight loss. 3 voids/2 stools in past 24 hours.  Mom reports that baby continues to cluster feed and does not act content after feeding.  Breasts are soft.  Discussed weight loss and that supplementation may be necessary.  Symphony pump set up and initiated to stimulate milk supply and possibly provide additional calories.  Mom pumped 3 mls which we fed baby with curved tip syringe.  Baby still showing feeding cues and parents would like to supplement with formula.  Assisted with positioning baby in football hold.  Baby latched with shallow latch initially.  5 french feeding tube inserted into the side of baby's mouth for formula supplementation.  As baby received volume latch improved.  Baby eagerly took 20 mls of SImilac Advance.  He was content and relaxed after feeding.  Plan is to continue to feed with cues supplementing with 20-30 mls every 3 hours, post pump and give any expressed milk back to baby.  Encouraged to call for assist prn.  Maternal Data    Feeding Feeding Type: Breast Fed  LATCH Score                   Interventions    Lactation Tools Discussed/Used Pump Review: Setup, frequency, and cleaning;Milk Storage Initiated by:: LMoulden Date initiated:: 12/31/18   Consult Status      Rock Nephew S 12/31/2018, 8:59 AM

## 2018-12-31 NOTE — Progress Notes (Signed)
POSTOPERATIVE DAY # 2 S/P CS - arrest of active labor  S:         Reports feeling ok - pain well-controlled with most pain during moving              back of legs "thick" and sore with swelling             Tolerating po intake / no nausea / no vomiting / + flatus / no BM             Bleeding is light             Up ad lib / ambulatory/ voiding QS  Newborn Breast with supplemental S&S/ Circumcision completed  O:  VS: BP 124/87 (BP Location: Left Arm)   Pulse 70   Temp 98.7 F (37.1 C) (Oral)   Resp 18   Ht 5\' 6"  (1.676 m)   Wt 116.1 kg   SpO2 100%   Breastfeeding Unknown   BMI 41.32 kg/m   BP: 124/87 - 119/72 - 123/94 - 116/76  LABS:              Recent Labs    12/29/18 0202 12/29/18 0527  WBC 31.5* 26.3*  HGB 9.0* 8.4*  PLT 257 257               Bloodtype: --/--/B POS, B POS (04/20 0016)  Rubella: Immune (09/27 0000)                                Physical Exam:             Alert and Oriented X3  Lungs: Clear and unlabored  Heart: regular rate and rhythm / no mumurs  Abdomen: soft, non-tender, non-distended, active BS, panus without edema or erythema             Fundus: firm, non-tender, Ueven              Dressing intact              Incision:  approximated with suture / no erythema / no ecchymosis / no drainage  Extremities: 1+edema with dependent edema back of thighs and hands, no calf pain or tenderness  A:        POD # 2 S/P CS - arrest of descent            ABL: anemia            Chronic hypertension - no medications   P:        Routine postoperative care              Add 3 day course HCTZ 12.5 to reduce edema for comfort             DC home - DC instructions/ booklet from [facility] reviewed               Marlinda Mike CNM, MSN, [facility] 12/31/2018, 9:36 AM

## 2020-02-24 ENCOUNTER — Encounter: Payer: Self-pay | Admitting: Family

## 2020-02-24 ENCOUNTER — Ambulatory Visit (INDEPENDENT_AMBULATORY_CARE_PROVIDER_SITE_OTHER): Payer: BC Managed Care – PPO | Admitting: Family

## 2020-02-24 VITALS — BP 139/96

## 2020-02-24 DIAGNOSIS — I10 Essential (primary) hypertension: Secondary | ICD-10-CM

## 2020-02-24 DIAGNOSIS — D509 Iron deficiency anemia, unspecified: Secondary | ICD-10-CM | POA: Diagnosis not present

## 2020-02-24 MED ORDER — HYDROCHLOROTHIAZIDE 12.5 MG PO TABS: 12.5000 mg | ORAL_TABLET | Freq: Every day | ORAL | 0 refills | Status: DC

## 2020-02-24 NOTE — Progress Notes (Signed)
   Virtual Visit via telephone Note Due to COVID-19 pandemic this visit was conducted virtually. This visit type was conducted due to national recommendations for restrictions regarding the COVID-19 Pandemic (e.g. social distancing, sheltering in place) in an effort to limit this patient's exposure and mitigate transmission in our community. All issues noted in this document were discussed and addressed.  A physical exam was not performed with this format.  I connected with Marissa Blake on 02/24/20 at 8:07 AM by telephone and verified that I am speaking with the correct person using two identifiers. Marissa Blake is currently located at home and son is currently with her during visit. The provider, Jannifer Rodney, FNP is located in their office at time of visit.  I discussed the limitations, risks, security and privacy concerns of performing an evaluation and management service by telephone and the availability of in person appointments. I also discussed with the patient that there may be a patient responsible charge related to this service. The patient expressed understanding and agreed to proceed.   History and Present Illness:  Hypertension This is a recurrent problem. The current episode started more than 1 month ago. The problem has been waxing and waning since onset. The problem is uncontrolled. Associated symptoms include headaches and malaise/fatigue. Pertinent negatives include no peripheral edema or shortness of breath. Risk factors for coronary artery disease include obesity and family history. Past treatments include nothing. The current treatment provides no improvement. There is no history of kidney disease or CAD/MI.  Anemia Presents for follow-up visit. Symptoms include malaise/fatigue. There has been no bruising/bleeding easily.      Review of Systems  Constitutional: Positive for malaise/fatigue.  Respiratory: Negative for shortness of breath.   Neurological: Positive for  headaches.  Endo/Heme/Allergies: Does not bruise/bleed easily.  All other systems reviewed and are negative.    Observations/Objective: No SOB or distress noted  Assessment and Plan: 1. Iron deficiency anemia, unspecified iron deficiency anemia type Continue iron supplement   2. Essential hypertension Will restart HCTZ 12.5 mg today -Dash diet information given -Exercise encouraged - Stress Management  -Continue current meds -RTO in 2 weeks to recheck  - hydrochlorothiazide (HYDRODIURIL) 12.5 MG tablet; Take 1 tablet (12.5 mg total) by mouth daily.  Dispense: 5 tablet; Refill: 0   Follow Up Instructions: 2 weeks     I discussed the assessment and treatment plan with the patient. The patient was provided an opportunity to ask questions and all were answered. The patient agreed with the plan and demonstrated an understanding of the instructions.   The patient was advised to call back or seek an in-person evaluation if the symptoms worsen or if the condition fails to improve as anticipated.  The above assessment and management plan was discussed with the patient. The patient verbalized understanding of and has agreed to the management plan. Patient is aware to call the clinic if symptoms persist or worsen. Patient is aware when to return to the clinic for a follow-up visit. Patient educated on when it is appropriate to go to the emergency department.   Time call ended:  8:25 AM   I provided 13 minutes of non-face-to-face time during this encounter.    Jannifer Rodney, FNP

## 2020-03-21 ENCOUNTER — Ambulatory Visit: Payer: BC Managed Care – PPO | Admitting: Family

## 2020-03-21 ENCOUNTER — Other Ambulatory Visit: Payer: Self-pay

## 2020-03-21 ENCOUNTER — Encounter: Payer: Self-pay | Admitting: Family

## 2020-03-21 ENCOUNTER — Ambulatory Visit (INDEPENDENT_AMBULATORY_CARE_PROVIDER_SITE_OTHER): Payer: BC Managed Care – PPO | Admitting: Family

## 2020-03-21 DIAGNOSIS — I10 Essential (primary) hypertension: Secondary | ICD-10-CM

## 2020-03-21 MED ORDER — HYDROCHLOROTHIAZIDE 12.5 MG PO TABS: 12.5000 mg | ORAL_TABLET | Freq: Every day | ORAL | 1 refills | Status: DC

## 2020-03-21 NOTE — Patient Instructions (Signed)

## 2020-03-21 NOTE — Progress Notes (Signed)
Subjective:    Patient ID: Marissa Blake, female    DOB: 10-11-1990, 29 y.o.   MRN: [id number]  Chief Complaint  Patient presents with  . Medical Management of Chronic Issues  . Hypertension   PT presents to the office today to recheck HTN. She was seen on 02/08/20 and started on HCTZ 12.5 mg, however, she states she received 5 tabs of this.  Her BP about the same.  Hypertension This is a chronic problem. The current episode started more than 1 year ago. The problem has been waxing and waning since onset. The problem is uncontrolled. Pertinent negatives include no malaise/fatigue, peripheral edema or shortness of breath. Risk factors for coronary artery disease include obesity. Past treatments include diuretics. The current treatment provides mild improvement.      Review of Systems  Constitutional: Negative for malaise/fatigue.  Respiratory: Negative for shortness of breath.   All other systems reviewed and are negative.      Objective:   Physical Exam Vitals reviewed.  Constitutional:      General: She is not in acute distress.    Appearance: She is well-developed.  HENT:     Head: Normocephalic and atraumatic.     Right Ear: Tympanic membrane normal.     Left Ear: Tympanic membrane normal.  Eyes:     Pupils: Pupils are equal, round, and reactive to light.  Neck:     Thyroid: No thyromegaly.  Cardiovascular:     Rate and Rhythm: Normal rate and regular rhythm.     Heart sounds: Normal heart sounds. No murmur heard.   Pulmonary:     Effort: Pulmonary effort is normal. No respiratory distress.     Breath sounds: Normal breath sounds. No wheezing.  Abdominal:     General: Bowel sounds are normal. There is no distension.     Palpations: Abdomen is soft.     Tenderness: There is no abdominal tenderness.  Musculoskeletal:        General: No tenderness. Normal range of motion.     Cervical back: Normal range of motion and neck supple.  Skin:    General: Skin is warm and  dry.  Neurological:     Mental Status: She is alert and oriented to person, place, and time.     Cranial Nerves: No cranial nerve deficit.     Deep Tendon Reflexes: Reflexes are normal and symmetric.  Psychiatric:        Behavior: Behavior normal.        Thought Content: Thought content normal.        Judgment: Judgment normal.     BP (!) 135/93   Pulse 66   Temp 98 F (36.7 C)   Ht 5\' 6"  (1.676 m)   Wt 256 lb (116.1 kg)   SpO2 99%   BMI 41.32 kg/m        Assessment & Plan:  LAQUENTA WHITSELL comes in today with chief complaint of Medical Management of Chronic Issues and Hypertension   Diagnosis and orders addressed:  1. Essential hypertension Will restart HCTZ 12.5 mg today -Daily blood pressure log given with instructions on how to fill out and told to bring to next visit -Dash diet information given -Exercise encouraged - Stress Management  -Continue current meds -RTO in 2 weeks to recheck and for CPE - hydrochlorothiazide (HYDRODIURIL) 12.5 MG tablet; Take 1 tablet (12.5 mg total) by mouth daily.  Dispense: 90 tablet; Refill: 1  Marissa Junes, FNP

## 2020-04-11 ENCOUNTER — Encounter: Payer: BC Managed Care – PPO | Admitting: Family

## 2020-05-11 ENCOUNTER — Encounter: Payer: BC Managed Care – PPO | Admitting: Family

## 2020-07-18 ENCOUNTER — Ambulatory Visit: Payer: BC Managed Care – PPO | Admitting: Family

## 2020-09-10 ENCOUNTER — Other Ambulatory Visit: Payer: Self-pay | Admitting: Family

## 2020-09-10 DIAGNOSIS — I10 Essential (primary) hypertension: Secondary | ICD-10-CM

## 2020-10-08 ENCOUNTER — Other Ambulatory Visit: Payer: Self-pay | Admitting: Family

## 2020-10-08 DIAGNOSIS — I10 Essential (primary) hypertension: Secondary | ICD-10-CM

## 2020-10-11 ENCOUNTER — Other Ambulatory Visit: Payer: Self-pay | Admitting: Family

## 2020-10-11 DIAGNOSIS — I10 Essential (primary) hypertension: Secondary | ICD-10-CM

## 2020-10-11 NOTE — Telephone Encounter (Signed)
[place]. NTBS 30 days given 09/11/20 

## 2020-11-10 ENCOUNTER — Encounter: Payer: Self-pay | Admitting: Family

## 2020-11-10 ENCOUNTER — Other Ambulatory Visit: Payer: Self-pay

## 2020-11-10 ENCOUNTER — Ambulatory Visit (INDEPENDENT_AMBULATORY_CARE_PROVIDER_SITE_OTHER): Payer: BC Managed Care – PPO | Admitting: Family

## 2020-11-10 VITALS — BP 127/87 | HR 71 | Temp 98.0°F | Ht 66.0 in | Wt 256.2 lb

## 2020-11-10 DIAGNOSIS — Z1159 Encounter for screening for other viral diseases: Secondary | ICD-10-CM

## 2020-11-10 DIAGNOSIS — F411 Generalized anxiety disorder: Secondary | ICD-10-CM

## 2020-11-10 DIAGNOSIS — D509 Iron deficiency anemia, unspecified: Secondary | ICD-10-CM

## 2020-11-10 DIAGNOSIS — F321 Major depressive disorder, single episode, moderate: Secondary | ICD-10-CM

## 2020-11-10 DIAGNOSIS — Z0001 Encounter for general adult medical examination with abnormal findings: Secondary | ICD-10-CM | POA: Diagnosis not present

## 2020-11-10 DIAGNOSIS — R102 Pelvic and perineal pain: Secondary | ICD-10-CM | POA: Diagnosis not present

## 2020-11-10 DIAGNOSIS — Z0000 Encounter for general adult medical examination without abnormal findings: Secondary | ICD-10-CM

## 2020-11-10 LAB — URINALYSIS, COMPLETE
Bilirubin, UA: NEGATIVE
Glucose, UA: NEGATIVE
Ketones, UA: NEGATIVE
Nitrite, UA: NEGATIVE
Protein,UA: NEGATIVE
Specific Gravity, UA: 1.025 (ref 1.005–1.030)
Urobilinogen, Ur: 0.2 mg/dL (ref 0.2–1.0)
pH, UA: 7 (ref 5.0–7.5)

## 2020-11-10 LAB — MICROSCOPIC EXAMINATION
Bacteria, UA: NONE SEEN
RBC, Urine: NONE SEEN /hpf (ref 0–2)
WBC, UA: NONE SEEN /hpf (ref 0–5)

## 2020-11-10 MED ORDER — ESCITALOPRAM OXALATE 10 MG PO TABS: 10.0000 mg | ORAL_TABLET | Freq: Every day | ORAL | 3 refills | Status: DC

## 2020-11-10 NOTE — Patient Instructions (Signed)
Major Depressive Disorder, Adult Major depressive disorder (MDD) is a mental health condition. It may also be called clinical depression or unipolar depression. MDD causes symptoms of sadness, hopelessness, and loss of interest in things. These symptoms last most of the day, almost every day, for 2 weeks. MDD can also cause physical symptoms. It can interfere with relationships and with everyday activities, such as work, school, and activities that are usually pleasant. MDD may be mild, moderate, or severe. It may be single-episode MDD, which happens once, or recurrent MDD, which may occur multiple times. What are the causes? The exact cause of this condition is not known. MDD is most likely caused by a combination of things, which may include:  Your personality traits.  Learned or conditioned behaviors or thoughts or feelings that reinforce negativity.  Any alcohol or substance misuse.  Long-term (chronic) physical or mental health illness.  Going through a traumatic experience or major life changes. What increases the risk? The following factors may make someone more likely to develop MDD:  A family history of depression.  Being a woman.  Troubled family relationships.  Abnormally low levels of certain brain chemicals.  Traumatic or painful events in childhood, especially abuse or loss of a parent.  A lot of stress from life experiences, such as poor living conditions or discrimination.  Chronic physical illness or other mental health disorders. What are the signs or symptoms? The main symptoms of MDD usually include:  Constant depressed or irritable mood.  A loss of interest in things and activities. Other symptoms include:  Sleeping or eating too much or too little.  Unexplained weight gain or weight loss.  Tiredness or low energy.  Being agitated, restless, or weak.  Feeling hopeless, worthless, or guilty.  Trouble thinking clearly or making  decisions.  Thoughts of suicide or thoughts of harming others.  Isolating oneself or avoiding other people or activities.  Trouble completing tasks, work, or any normal obligations. Severe symptoms of this condition may include:  Psychotic depression.This may include false beliefs, or delusions. It may also include seeing, hearing, tasting, smelling, or feeling things that are not real (hallucinations).  Chronic depression or persistent depressive disorder. This is low-level depression that lasts for at least 2 years.  Melancholic depression, or feeling extremely sad and hopeless.  Catatonic depression, which includes trouble speaking and trouble moving. How is this diagnosed? This condition may be diagnosed based on:  Your symptoms.  Your medical and mental health history. You may be asked questions about your lifestyle, including any drug and alcohol use.  A physical exam.  Blood tests to rule out other conditions. MDD is confirmed if you have the following symptoms most of the day, nearly every day, in a 2-week period:  Either a depressed mood or loss of interest.  At least four other MDD symptoms. How is this treated? This condition is usually treated by mental health professionals, such as psychologists, psychiatrists, and clinical social workers. You may need more than one type of treatment. Treatment may include:  Psychotherapy, also called talk therapy or counseling. Types of psychotherapy include: ? Cognitive behavioral therapy (CBT). This teaches you to recognize unhealthy feelings, thoughts, and behaviors, and replace them with positive thoughts and actions. ? Interpersonal therapy (IPT). This helps you to improve the way you communicate with others or relate to them. ? Family therapy. This treatment includes members of your family.  Medicines to treat anxiety and depression. These medicines help to balance the brain chemicals   that affect your emotions.  Lifestyle  changes. You may be asked to: ? Limit alcohol use and avoid drug use. ? Get regular exercise. ? Get plenty of sleep. ? Make healthy eating choices. ? Spend more time outdoors.  Brain stimulation. This may be done if symptoms are very severe and other treatments have not worked. Examples of this treatment are electroconvulsive therapy and transcranial magnetic stimulation. Follow these instructions at home: Activity  Exercise regularly and spend time outdoors.  Find activities that you enjoy doing, and make time to do them.  Find healthy ways to manage stress, such as: ? Meditation or deep breathing. ? Spending time in nature. ? Journaling.  Return to your normal activities as told by your health care provider. Ask your health care provider what activities are safe for you. Alcohol and drug use  If you drink alcohol: ? Limit how much you use to:  0-1 drink a day for women who are not pregnant.  0-2 drinks a day for men. ? Be aware of how much alcohol is in your drink. In the [place] one drink equals one 12 oz bottle of beer (355 mL), one 5 oz glass of wine (148 mL), or one 1 oz glass of hard liquor (44 mL). ? Discuss your alcohol use with your health care provider. Alcohol can affect any antidepressant medicines you are taking.  Discuss any drug use with your health care provider. General instructions  Take over-the-counter and prescription medicines only as told by your health care provider.  Eat a healthy diet and get plenty of sleep.  Consider joining a support group. Your health care provider may be able to recommend one.  Keep all follow-up visits as told by your health care provider. This is important.   Where to find more information  National Alliance on Mental Illness: www.nami.org  [place] National Institute of Mental Health: www.nimh.nih.gov Contact a health care provider if:  Your symptoms get worse.  You develop new symptoms. Get help right away if:  You  self-harm.  You have serious thoughts about hurting yourself or others.  You hallucinate. If you ever feel like you may hurt yourself or others, or have thoughts about taking your own life, get help right away. Go to your nearest emergency department or:  Call your local emergency services (911 in the [place]  Call a suicide crisis helpline, such as the National Suicide Prevention Lifeline at 1-800-273-8255. This is open 24 hours a day in the [place]  Text the Crisis Text Line at 741741 (in the [place] Summary  Major depressive disorder (MDD) is a mental health condition. MDD causes symptoms of sadness, hopelessness, and loss of interest in things. These symptoms last most of the day, almost every day, for 2 weeks.  The symptoms of MDD can interfere with relationships and with everyday activities.  Treatments and support are available for people who develop MDD. You may need more than one type of treatment.  Get help right away if you have serious thoughts about hurting yourself or others. This information is not intended to replace advice given to you by your health care provider. Make sure you discuss any questions you have with your health care provider. Document Revised: 08/07/2019 Document Reviewed: 08/07/2019 Elsevier Patient Education  2021 Elsevier Inc.  

## 2020-11-10 NOTE — Progress Notes (Signed)
Subjective:    Patient ID: Marissa Blake, female    DOB: 08/16/1991, 30 y.o.   MRN: [id number]  Chief Complaint  Patient presents with   Annual Exam     pelvic pain x 2 weeks.    Anxiety    2 years    Pt presents to the office today for CPE without pap. She is followed by GYN annually.   She is complaining of pelvic pressure that started 2 weeks ago. She reports it is a 5 out 10 and worse at night.  She states she had one day of foul odor that has improved.   She is complaining of anxiety over the last 2 years. She is doing therapy every 2 weeks that has helped slight. Anxiety Presents for follow-up visit. Symptoms include excessive worry, irritability, nervous/anxious behavior and restlessness. Symptoms occur most days. The severity of symptoms is moderate.    Depression        The onset quality is gradual.   Associated symptoms include helplessness, hopelessness, irritable, restlessness and sad.  Past treatments include nothing.  Past medical history includes anxiety.       Review of Systems  Constitutional: Positive for irritability.  Psychiatric/Behavioral: Positive for depression. The patient is nervous/anxious.   All other systems reviewed and are negative.  Family History  Problem Relation Age of Onset   Autism Sister    Hypertension Father    Social History   Socioeconomic History   Marital status: Married    Spouse name: Not on file   Number of children: Not on file   Years of education: Not on file   Highest education level: Not on file  Occupational History   Not on file  Tobacco Use   Smoking status: Never Smoker   Smokeless tobacco: Never Used  Vaping Use   Vaping Use: Never used  Substance and Sexual Activity   Alcohol use: Not Currently    Comment: social   Drug use: No   Sexual activity: Yes    Birth control/protection: Pill  Other Topics Concern   Not on file  Social History Narrative   Not on file   Social Determinants  of Health   Financial Resource Strain: Not on file  Food Insecurity: Not on file  Transportation Needs: Not on file  Physical Activity: Not on file  Stress: Not on file  Social Connections: Not on file       Objective:   Physical Exam Vitals reviewed.  Constitutional:      General: She is irritable. She is not in acute distress.    Appearance: She is well-developed and well-nourished. She is obese.  HENT:     Head: Normocephalic and atraumatic.     Right Ear: Tympanic membrane normal.     Left Ear: Tympanic membrane normal.     Mouth/Throat:     Mouth: Oropharynx is clear and moist.  Eyes:     Pupils: Pupils are equal, round, and reactive to light.  Neck:     Thyroid: No thyromegaly.  Cardiovascular:     Rate and Rhythm: Normal rate and regular rhythm.     Pulses: Intact distal pulses.     Heart sounds: Normal heart sounds. No murmur heard.   Pulmonary:     Effort: Pulmonary effort is normal. No respiratory distress.     Breath sounds: Normal breath sounds. No wheezing.  Abdominal:     General: Bowel sounds are normal. There is no distension.  Palpations: Abdomen is soft.     Tenderness: There is no abdominal tenderness.  Musculoskeletal:        General: No tenderness or edema. Normal range of motion.     Cervical back: Normal range of motion and neck supple.  Skin:    General: Skin is warm and dry.  Neurological:     Mental Status: She is alert and oriented to person, place, and time.     Cranial Nerves: No cranial nerve deficit.     Deep Tendon Reflexes: Reflexes are normal and symmetric.  Psychiatric:        Mood and Affect: Mood and affect normal.        Behavior: Behavior normal.        Thought Content: Thought content normal.        Judgment: Judgment normal.       BP 127/87    Pulse 71    Temp 98 F (36.7 C) (Temporal)    Ht 5' 6"  (1.676 m)    Wt 256 lb 3.2 oz (116.2 kg)    BMI 41.35 kg/m      Assessment & Plan:  Marissa Blake comes in today  with chief complaint of Annual Exam ( pelvic pain x 2 weeks. ) and Anxiety (2 years )   Diagnosis and orders addressed:  1. Pelvic pain -Follow up with GYN. May need transvaginal US. Urine negative. I suspect it is an ovarian cyst given symptoms.  Call if symptoms worsen or do not improve  - Urinalysis, Complete - Urine Culture - CMP14+EGFR - CBC with Differential/Platelet  2. Annual physical exam - CMP14+EGFR - CBC with Differential/Platelet - Anemia Profile B - Lipid panel - TSH - Hepatitis C antibody  3. Morbid obesity ([place]) - CMP14+EGFR - CBC with Differential/Platelet  4. Iron deficiency anemia, unspecified iron deficiency anemia type - CMP14+EGFR - CBC with Differential/Platelet - Anemia Profile B  5. GAD (generalized anxiety disorder) - CMP14+EGFR - CBC with Differential/Platelet - escitalopram (LEXAPRO) 10 MG tablet; Take 1 tablet (10 mg total) by mouth daily.  Dispense: 90 tablet; Refill: 3  6. Depression, major, single episode, moderate (HCC) Will start Lexapro 10 mg today Stress management RTO in 4 weeks  - CMP14+EGFR - CBC with Differential/Platelet - escitalopram (LEXAPRO) 10 MG tablet; Take 1 tablet (10 mg total) by mouth daily.  Dispense: 90 tablet; Refill: 3  7. Need for hepatitis C screening test - Hepatitis C antibody   Labs pending Health Maintenance reviewed Diet and exercise encouraged  Follow up plan: 1 month   Evelina Dun, FNP

## 2020-11-12 LAB — URINE CULTURE

## 2020-11-30 ENCOUNTER — Encounter: Payer: Self-pay | Admitting: *Deleted

## 2020-12-08 ENCOUNTER — Ambulatory Visit: Payer: BC Managed Care – PPO | Admitting: Family

## 2020-12-14 ENCOUNTER — Ambulatory Visit (INDEPENDENT_AMBULATORY_CARE_PROVIDER_SITE_OTHER): Payer: BC Managed Care – PPO | Admitting: Family

## 2020-12-14 ENCOUNTER — Encounter: Payer: Self-pay | Admitting: Family

## 2020-12-14 DIAGNOSIS — F411 Generalized anxiety disorder: Secondary | ICD-10-CM | POA: Diagnosis not present

## 2020-12-14 DIAGNOSIS — F321 Major depressive disorder, single episode, moderate: Secondary | ICD-10-CM

## 2020-12-14 MED ORDER — ESCITALOPRAM OXALATE 20 MG PO TABS: 20.0000 mg | ORAL_TABLET | Freq: Every day | ORAL | 1 refills | Status: DC

## 2020-12-14 NOTE — Progress Notes (Signed)
Virtual Visit  Note Due to COVID-19 pandemic this visit was conducted virtually. This visit type was conducted due to national recommendations for restrictions regarding the COVID-19 Pandemic (e.g. social distancing, sheltering in place) in an effort to limit this patient's exposure and mitigate transmission in our community. All issues noted in this document were discussed and addressed.  A physical exam was not performed with this format.  I connected with Marissa Blake on 12/14/20 at 9:02 AM  by telephone and verified that I am speaking with the correct person using two identifiers. Marissa Blake is currently located at work and no one  is currently with her during visit. The provider, Jannifer Rodney, FNP is located in their office at time of visit.  I discussed the limitations, risks, security and privacy concerns of performing an evaluation and management service by telephone and the availability of in person appointments. I also discussed with the patient that there may be a patient responsible charge related to this service. The patient expressed understanding and agreed to proceed.   History and Present Illness:  Pt calls the office today to recheck GAD and Depression. She was seen on 11/10/20 and started on Lexapro 10 mg. She reports she is feeling much better and has actually lost 10 lbs because she is not binge eating. She does report feeling anxious at times.  Anxiety Presents for follow-up visit. Symptoms include depressed mood, excessive worry, irritability, nervous/anxious behavior and restlessness. Symptoms occur most days. The severity of symptoms is moderate.    Depression        This is a chronic problem.  The current episode started more than 1 year ago.   The onset quality is gradual.   The problem occurs intermittently.  Associated symptoms include restlessness and sad.  Associated symptoms include no helplessness and no hopelessness.  Past treatments include SSRIs -  Selective serotonin reuptake inhibitors.  Past medical history includes anxiety.       Review of Systems  Constitutional: Positive for irritability.  Psychiatric/Behavioral: Positive for depression. The patient is nervous/anxious.   All other systems reviewed and are negative.    Observations/Objective: No SOB or distress noted   Assessment and Plan: 1. GAD (generalized anxiety disorder) - escitalopram (LEXAPRO) 20 MG tablet; Take 1 tablet (20 mg total) by mouth daily.  Dispense: 90 tablet; Refill: 1  2. Depression, major, single episode, moderate (HCC) - escitalopram (LEXAPRO) 20 MG tablet; Take 1 tablet (20 mg total) by mouth daily.  Dispense: 90 tablet; Refill: 1  Will increase Lexapro to 20 mg from 10 mg Stress management  RTO in 4-6 weeks to check   I discussed the assessment and treatment plan with the patient. The patient was provided an opportunity to ask questions and all were answered. The patient agreed with the plan and demonstrated an understanding of the instructions.   The patient was advised to call back or seek an in-person evaluation if the symptoms worsen or if the condition fails to improve as anticipated.  The above assessment and management plan was discussed with the patient. The patient verbalized understanding of and has agreed to the management plan. Patient is aware to call the clinic if symptoms persist or worsen. Patient is aware when to return to the clinic for a follow-up visit. Patient educated on when it is appropriate to go to the emergency department.   Time call ended:  9:13 AM   I provided 11 minutes of  non face-to-face time  during this encounter.    Evelina Dun, FNP

## 2021-01-08 ENCOUNTER — Encounter: Payer: Self-pay | Admitting: Family Medicine

## 2021-01-18 ENCOUNTER — Ambulatory Visit (INDEPENDENT_AMBULATORY_CARE_PROVIDER_SITE_OTHER): Payer: BC Managed Care – PPO | Admitting: Family

## 2021-01-18 ENCOUNTER — Encounter: Payer: Self-pay | Admitting: Family

## 2021-01-18 NOTE — Progress Notes (Signed)
     Attempted to call patient and no answer. VM left and no return call.   Jannifer Rodney, FNP

## 2021-07-05 ENCOUNTER — Other Ambulatory Visit: Payer: Self-pay | Admitting: Family

## 2021-07-05 DIAGNOSIS — I10 Essential (primary) hypertension: Secondary | ICD-10-CM

## 2021-07-09 ENCOUNTER — Other Ambulatory Visit: Payer: Self-pay

## 2021-07-09 DIAGNOSIS — I10 Essential (primary) hypertension: Secondary | ICD-10-CM

## 2021-07-09 MED ORDER — HYDROCHLOROTHIAZIDE 12.5 MG PO TABS: 12.5000 mg | ORAL_TABLET | Freq: Every day | ORAL | 0 refills | Status: DC

## 2021-07-25 ENCOUNTER — Ambulatory Visit: Payer: BC Managed Care – PPO

## 2021-07-25 ENCOUNTER — Other Ambulatory Visit: Payer: BC Managed Care – PPO

## 2021-07-25 ENCOUNTER — Other Ambulatory Visit: Payer: Self-pay

## 2021-07-25 DIAGNOSIS — Z111 Encounter for screening for respiratory tuberculosis: Secondary | ICD-10-CM

## 2021-07-31 ENCOUNTER — Telehealth: Payer: BC Managed Care – PPO | Admitting: Family Medicine

## 2021-07-31 ENCOUNTER — Other Ambulatory Visit: Payer: Self-pay | Admitting: Family

## 2021-07-31 DIAGNOSIS — I10 Essential (primary) hypertension: Secondary | ICD-10-CM

## 2021-07-31 DIAGNOSIS — J069 Acute upper respiratory infection, unspecified: Secondary | ICD-10-CM

## 2021-07-31 DIAGNOSIS — J020 Streptococcal pharyngitis: Secondary | ICD-10-CM

## 2021-07-31 LAB — QUANTIFERON-TB GOLD PLUS

## 2021-07-31 MED ORDER — BENZONATATE 100 MG PO CAPS: 100.0000 mg | ORAL_CAPSULE | Freq: Two times a day (BID) | ORAL | 0 refills | Status: DC | PRN

## 2021-07-31 MED ORDER — AMOXICILLIN 500 MG PO CAPS: 500.0000 mg | ORAL_CAPSULE | Freq: Two times a day (BID) | ORAL | 0 refills | Status: AC

## 2021-07-31 MED ORDER — IPRATROPIUM BROMIDE 0.03 % NA SOLN: 2.0000 | Freq: Two times a day (BID) | NASAL | 0 refills | Status: DC

## 2021-07-31 NOTE — Progress Notes (Signed)
E-Visit for Sinus Problems  We are sorry that you are not feeling well.  Here is how we plan to help!  Based on what you have shared with me it looks like you have sinusitis.  Sinusitis is inflammation and infection in the sinus cavities of the head.  Based on your presentation I believe you most likely have Acute Viral Sinusitis.This is an infection most likely caused by a virus. There is not specific treatment for viral sinusitis other than to help you with the symptoms until the infection runs its course.  You may use an oral decongestant such as Mucinex D or if you have glaucoma or high blood pressure use plain Mucinex. Saline nasal spray help and can safely be used as often as needed for congestion, I have prescribed: Ipratropium Bromide nasal spray 0.03% 2 sprays in eah nostril 2-3 times a day  Tessalon Perles for cough have been ordered as well.  Some authorities believe that zinc sprays or the use of Echinacea may shorten the course of your symptoms.  Sinus infections are not as easily transmitted as other respiratory infection, however we still recommend that you avoid close contact with loved ones, especially the very young and elderly.  Remember to wash your hands thoroughly throughout the day as this is the number one way to prevent the spread of infection!  Home Care: Only take medications as instructed by your medical team. Do not take these medications with alcohol. A steam or ultrasonic humidifier can help congestion.  You can place a towel over your head and breathe in the steam from hot water coming from a faucet. Avoid close contacts especially the very young and the elderly. Cover your mouth when you cough or sneeze. Always remember to wash your hands.  Get Help Right Away If: You develop worsening fever or sinus pain. You develop a severe head ache or visual changes. Your symptoms persist after you have completed your treatment plan.  Make sure you Understand these  instructions. Will watch your condition. Will get help right away if you are not doing well or get worse.   Thank you for choosing an e-visit.  Your e-visit answers were reviewed by a board certified advanced clinical practitioner to complete your personal care plan. Depending upon the condition, your plan could have included both over the counter or prescription medications.  Please review your pharmacy choice. Make sure the pharmacy is open so you can pick up prescription now. If there is a problem, you may contact your provider through [employer] and have the prescription routed to another pharmacy.  Your safety is important to [place]. If you have drug allergies check your prescription carefully.   For the next 24 hours you can use MyChart to ask questions about today's visit, request a non-urgent call back, or ask for a work or school excuse. You will get an email in the next two days asking about your experience. I hope that your e-visit has been valuable and will speed your recovery.   I provided 5 minutes of non face-to-face time during this encounter for chart review, medication and order placement, as well as and documentation.

## 2021-07-31 NOTE — Addendum Note (Signed)
Addended by: Margaretann Loveless on: 07/31/2021 12:26 PM   Modules accepted: Orders

## 2021-08-13 ENCOUNTER — Ambulatory Visit: Payer: BC Managed Care – PPO | Admitting: Family

## 2021-08-29 ENCOUNTER — Other Ambulatory Visit: Payer: Self-pay | Admitting: Family Medicine

## 2021-08-30 ENCOUNTER — Encounter: Payer: Self-pay | Admitting: Family

## 2021-08-30 ENCOUNTER — Ambulatory Visit: Payer: BC Managed Care – PPO | Admitting: Family

## 2021-08-30 VITALS — BP 128/88 | Temp 97.8°F | Ht 66.0 in | Wt 229.2 lb

## 2021-08-30 DIAGNOSIS — F411 Generalized anxiety disorder: Secondary | ICD-10-CM | POA: Diagnosis not present

## 2021-08-30 DIAGNOSIS — F321 Major depressive disorder, single episode, moderate: Secondary | ICD-10-CM

## 2021-08-30 DIAGNOSIS — Z020 Encounter for examination for admission to educational institution: Secondary | ICD-10-CM

## 2021-08-30 NOTE — Progress Notes (Signed)
° °  Subjective:    Patient ID: Marissa Blake, female    DOB: 27-Aug-1991, 30 y.o.   MRN: [id number]  Chief Complaint  Patient presents with   Titers for school   PT presents to the office today for chronic follow up and is followed by GYN annually. She start College next month and needs lab work drawn to start this.  Anxiety Presents for follow-up visit. Symptoms include excessive worry, irritability and nervous/anxious behavior. Patient reports no shortness of breath.    Hypertension This is a chronic problem. The current episode started more than 1 year ago. The problem has been resolved since onset. The problem is controlled. Associated symptoms include anxiety and malaise/fatigue. Pertinent negatives include no peripheral edema or shortness of breath. Risk factors for coronary artery disease include dyslipidemia and obesity. The current treatment provides moderate improvement.     Review of Systems  Constitutional:  Positive for irritability and malaise/fatigue.  Respiratory:  Negative for shortness of breath.   Psychiatric/Behavioral:  The patient is nervous/anxious.   All other systems reviewed and are negative.     Objective:   Physical Exam Vitals reviewed.  Constitutional:      General: She is not in acute distress.    Appearance: She is well-developed. She is obese.  HENT:     Head: Normocephalic and atraumatic.     Right Ear: Tympanic membrane normal.     Left Ear: Tympanic membrane normal.  Eyes:     Pupils: Pupils are equal, round, and reactive to light.  Neck:     Thyroid: No thyromegaly.  Cardiovascular:     Rate and Rhythm: Normal rate and regular rhythm.     Heart sounds: Normal heart sounds. No murmur heard. Pulmonary:     Effort: Pulmonary effort is normal. No respiratory distress.     Breath sounds: Normal breath sounds. No wheezing.  Abdominal:     General: Bowel sounds are normal. There is no distension.     Palpations: Abdomen is soft.      Tenderness: There is no abdominal tenderness.  Musculoskeletal:        General: No tenderness. Normal range of motion.     Cervical back: Normal range of motion and neck supple.  Skin:    General: Skin is warm and dry.  Neurological:     Mental Status: She is alert and oriented to person, place, and time.     Cranial Nerves: No cranial nerve deficit.     Deep Tendon Reflexes: Reflexes are normal and symmetric.  Psychiatric:        Behavior: Behavior normal.        Thought Content: Thought content normal.        Judgment: Judgment normal.         BP 128/88    Temp 97.8 F (36.6 C) (Temporal)    Ht 5\' 6"  (1.676 m)    Wt 229 lb 3.2 oz (104 kg)    BMI 36.99 kg/m   Assessment & Plan:  Marissa Blake comes in today with chief complaint of Titers for school   Diagnosis and orders addressed:  1. GAD (generalized anxiety disorder)  2. Depression, major, single episode, moderate (HCC)  3. Morbid obesity (HCC)  4. School physical exam - QuantiFERON-TB Gold Plus - Varicella zoster antibody, IgG   Labs pending Health Maintenance reviewed Diet and exercise encouraged  Follow up plan: 6 months    Marissa Junes, FNP

## 2021-08-30 NOTE — Patient Instructions (Signed)

## 2021-09-03 LAB — QUANTIFERON-TB GOLD PLUS
QuantiFERON Mitogen Value: >10 IU/mL
QuantiFERON Nil Value: 0 IU/mL
QuantiFERON TB1 Ag Value: 0 IU/mL
QuantiFERON TB2 Ag Value: 0 IU/mL
QuantiFERON-TB Gold Plus: NEGATIVE

## 2021-09-03 LAB — VARICELLA ZOSTER ANTIBODY, IGG: Varicella zoster IgG: 940 index (ref >165)

## 2021-09-09 ENCOUNTER — Other Ambulatory Visit: Payer: Self-pay | Admitting: Family

## 2021-09-09 DIAGNOSIS — I10 Essential (primary) hypertension: Secondary | ICD-10-CM

## 2021-10-01 ENCOUNTER — Encounter: Payer: Self-pay | Admitting: Family

## 2021-10-01 ENCOUNTER — Telehealth: Payer: BC Managed Care – PPO | Admitting: Family

## 2021-10-01 DIAGNOSIS — F411 Generalized anxiety disorder: Secondary | ICD-10-CM

## 2021-10-01 DIAGNOSIS — F321 Major depressive disorder, single episode, moderate: Secondary | ICD-10-CM

## 2021-10-01 MED ORDER — SERTRALINE HCL 100 MG PO TABS: 100.0000 mg | ORAL_TABLET | Freq: Every day | ORAL | 3 refills | Status: DC

## 2021-10-01 MED ORDER — BUSPIRONE HCL 5 MG PO TABS: 5.0000 mg | ORAL_TABLET | Freq: Three times a day (TID) | ORAL | 2 refills | Status: DC

## 2021-10-01 NOTE — Progress Notes (Signed)
Virtual Visit Consent   Marissa Blake, you are scheduled for a virtual visit with a [facility] provider today.     Just as with appointments in the office, your consent must be obtained to participate.  Your consent will be active for this visit and any virtual visit you may have with one of our providers in the next 365 days.     If you have a MyChart account, a copy of this consent can be sent to you electronically.  All virtual visits are billed to your insurance company just like a traditional visit in the office.    As this is a virtual visit, video technology does not allow for your provider to perform a traditional examination.  This may limit your provider's ability to fully assess your condition.  If your provider identifies any concerns that need to be evaluated in person or the need to arrange testing (such as labs, EKG, etc.), we will make arrangements to do so.     Although advances in technology are sophisticated, we cannot ensure that it will always work on either your end or our end.  If the connection with a video visit is poor, the visit may have to be switched to a telephone visit.  With either a video or telephone visit, we are not always able to ensure that we have a secure connection.     I need to obtain your verbal consent now.   Are you willing to proceed with your visit today?    Marissa Blake has provided verbal consent on 10/01/2021 for a virtual visit (video or telephone).   Jannifer Rodney, FNP   Date: 10/01/2021 12:40 PM   Virtual Visit via Video Note   I, Jannifer Rodney, connected with  Marissa Blake  ([id number], Dec 08, 1990) on 10/01/21 at 12:10 PM EST by a video-enabled telemedicine application and verified that I am speaking with the correct person using two identifiers.  Location: Patient: Virtual Visit Location Patient: Home Provider: Virtual Visit Location Provider: Office/Clinic   I discussed the limitations of evaluation and management by telemedicine  and the availability of in person appointments. The patient expressed understanding and agreed to proceed.    History of Present Illness: Marissa Blake is a 31 y.o. who identifies as a female who was assigned female at birth, and is being seen today for GAD and Depression. Pt states she feels overwhelmed. She is followed by [facility] every month and therapists every 2 weeks.   She reports she is going to school, full times job, wife, mother, and driving a bus.   HPI: Anxiety Presents for follow-up visit. Symptoms include depressed mood, excessive worry, irritability, nervous/anxious behavior, palpitations and restlessness. Symptoms occur occasionally. The severity of symptoms is moderate. The quality of sleep is good.    Depression        This is a chronic problem.  The current episode started more than 1 year ago.   The onset quality is gradual.   The problem occurs intermittently.  Associated symptoms include irritable, restlessness and sad.  Associated symptoms include no helplessness and no hopelessness.  Past medical history includes anxiety.    Problems:  Patient Active Problem List   Diagnosis Date Noted   Depression, major, single episode, moderate (HCC) 08/30/2021   GAD (generalized anxiety disorder) 08/30/2021   Maternal anemia, with delivery 12/29/2018   Postpartum care following cesarean delivery (4/21) 12/28/2018   Iron deficiency anemia 10/23/2017   Morbid obesity (  HCC) 10/22/2017   Colicky RUQ abdominal pain 12/23/2014    Allergies:  Allergies  Allergen Reactions   Benzoyl Peroxide Other (See Comments)    redness   Latex Other (See Comments)    Rash/irritation   Medications:  Current Outpatient Medications:    sertraline (ZOLOFT) 100 MG tablet, Take 1 tablet (100 mg total) by mouth daily., Disp: 90 tablet, Rfl: 3   busPIRone (BUSPAR) 5 MG tablet, Take 1 tablet (5 mg total) by mouth 3 (three) times daily., Disp: 90 tablet, Rfl: 2   cholecalciferol (VITAMIN  D3) 25 MCG (1000 UNIT) tablet, Take 1,000 Units by mouth daily., Disp: , Rfl:    ferrous sulfate 324 MG TBEC, Take 324 mg by mouth., Disp: , Rfl:    hydrochlorothiazide (HYDRODIURIL) 12.5 MG tablet, TAKE 1 TABLET (12.5 MG TOTAL) BY MOUTH DAILY. (NEEDS TO BE SEEN BEFORE NEXT REFILL), Disp: 30 tablet, Rfl: 0  Observations/Objective: Patient is well-developed, well-nourished in no acute distress.  Resting comfortably  at home.  Head is normocephalic, atraumatic.  No labored breathing.  Speech is clear and coherent with logical content.  Patient is alert and oriented at baseline.  Anxious  Assessment and Plan: 1. GAD (generalized anxiety disorder) - sertraline (ZOLOFT) 100 MG tablet; Take 1 tablet (100 mg total) by mouth daily.  Dispense: 90 tablet; Refill: 3 - busPIRone (BUSPAR) 5 MG tablet; Take 1 tablet (5 mg total) by mouth 3 (three) times daily.  Dispense: 90 tablet; Refill: 2  2. Depression, major, single episode, moderate (HCC) - sertraline (ZOLOFT) 100 MG tablet; Take 1 tablet (100 mg total) by mouth daily.  Dispense: 90 tablet; Refill: 3 - busPIRone (BUSPAR) 5 MG tablet; Take 1 tablet (5 mg total) by mouth 3 (three) times daily.  Dispense: 90 tablet; Refill: 2  Will increase Zoloft to 100 mg and increase Buspar 5 mg to TID from nightly.  Take 2.5 in AM and afternoon since the 5 mg makes her sleepy Work note given to stop driving bus Keep follow up with [employer]  Follow Up Instructions: I discussed the assessment and treatment plan with the patient. The patient was provided an opportunity to ask questions and all were answered. The patient agreed with the plan and demonstrated an understanding of the instructions.  A copy of instructions were sent to the patient via MyChart unless otherwise noted below.     The patient was advised to call back or seek an in-person evaluation if the symptoms worsen or if the condition fails to improve as anticipated.  Time:  I spent 13  minutes with the patient via telehealth technology discussing the above problems/concerns.    Jannifer Rodney, FNP

## 2021-10-01 NOTE — Patient Instructions (Signed)

## 2021-10-16 ENCOUNTER — Other Ambulatory Visit: Payer: Self-pay | Admitting: Family

## 2021-10-16 DIAGNOSIS — I10 Essential (primary) hypertension: Secondary | ICD-10-CM

## 2021-10-24 ENCOUNTER — Encounter: Payer: Self-pay | Admitting: Family

## 2021-10-27 ENCOUNTER — Other Ambulatory Visit: Payer: Self-pay | Admitting: Family

## 2021-10-27 DIAGNOSIS — F321 Major depressive disorder, single episode, moderate: Secondary | ICD-10-CM

## 2021-10-27 DIAGNOSIS — F411 Generalized anxiety disorder: Secondary | ICD-10-CM

## 2021-10-31 ENCOUNTER — Other Ambulatory Visit: Payer: Self-pay | Admitting: Family

## 2021-10-31 DIAGNOSIS — I10 Essential (primary) hypertension: Secondary | ICD-10-CM

## 2021-12-24 ENCOUNTER — Telehealth: Payer: BC Managed Care – PPO | Admitting: Nurse Practitioner

## 2021-12-24 DIAGNOSIS — B084 Enteroviral vesicular stomatitis with exanthem: Secondary | ICD-10-CM | POA: Diagnosis not present

## 2021-12-24 MED ORDER — PREDNISONE 10 MG PO TABS: 10.0000 mg | ORAL_TABLET | Freq: Two times a day (BID) | ORAL | 0 refills | Status: AC

## 2021-12-24 NOTE — Progress Notes (Signed)
?  Your symptoms do seem consistent with Hand foot and mouth. Since this is a viral illness there is not a specific treatment. We target symptom control. I will give you a short course of steroids that should help with the rash resolution and sore throat. You can also use sore throat lozenges for relief. If you develop a fever you should be seen in person by your primary care or at an urgent care.  ?Thank you ? ?E Visit for Rash ? ?We are sorry that you are not feeling well. Here is how we plan to help! ? ?Prednisone 10 mg twice daily for 5 days ? ? ?HOME CARE: ? ?Take cool showers and avoid direct sunlight. ?Apply cool compress or wet dressings. ?Take a bath in an oatmeal bath.  Sprinkle content of one Aveeno packet under running faucet with comfortably warm water.  Bathe for 15-20 minutes, 1-2 times daily.  Pat dry with a towel. Do not rub the rash. ?Use hydrocortisone cream. ?Take an antihistamine like Benadryl for widespread rashes that itch.  The adult dose of Benadryl is 25-50 mg by mouth 4 times daily. ?Caution:  This type of medication may cause sleepiness.  Do not drink alcohol, drive, or operate dangerous machinery while taking antihistamines.  Do not take these medications if you have prostate enlargement.  Read package instructions thoroughly on all medications that you take. ? ?GET HELP RIGHT AWAY IF: ? ?Symptoms don't go away after treatment. ?Severe itching that persists. ?If you rash spreads or swells. ?If you rash begins to smell. ?If it blisters and opens or develops a yellow-brown crust. ?You develop a fever. ?You have a sore throat. ?You become short of breath. ? ?MAKE SURE YOU: ? ?Understand these instructions. ?Will watch your condition. ?Will get help right away if you are not doing well or get worse. ? ?Thank you for choosing an e-visit. ? ?Your e-visit answers were reviewed by a board certified advanced clinical practitioner to complete your personal care plan. Depending upon the condition,  your plan could have included both over the counter or prescription medications. ? ?Please review your pharmacy choice. Make sure the pharmacy is open so you can pick up prescription now. If there is a problem, you may contact your provider through [employer] and have the prescription routed to another pharmacy.  Your safety is important to [place]. If you have drug allergies check your prescription carefully.  ? ?For the next 24 hours you can use MyChart to ask questions about today's visit, request a non-urgent call back, or ask for a work or school excuse. ?You will get an email in the next two days asking about your experience. I hope that your e-visit has been valuable and will speed your recovery.  ? ?I spent approximately 10 minutes reviewing the patient's history, current symptoms and coordinating their care today.   ? ?Meds ordered this encounter  ?Medications  ? predniSONE (DELTASONE) 10 MG tablet  ?  Sig: Take 1 tablet (10 mg total) by mouth 2 (two) times daily with a meal for 5 days.  ?  Dispense:  10 tablet  ?  Refill:  0  ?  ?

## 2022-03-13 ENCOUNTER — Telehealth: Payer: BC Managed Care – PPO | Admitting: Nurse Practitioner

## 2022-03-13 DIAGNOSIS — N760 Acute vaginitis: Secondary | ICD-10-CM

## 2022-03-13 DIAGNOSIS — B9689 Other specified bacterial agents as the cause of diseases classified elsewhere: Secondary | ICD-10-CM | POA: Diagnosis not present

## 2022-03-14 MED ORDER — METRONIDAZOLE 500 MG PO TABS: 500.0000 mg | ORAL_TABLET | Freq: Two times a day (BID) | ORAL | 0 refills | Status: AC

## 2022-03-14 NOTE — Progress Notes (Signed)
E-Visit for Vaginal Symptoms  We are sorry that you are not feeling well. Here is how we plan to help! Based on what you shared with me it looks like you: May have a vaginosis due to bacteria  Vaginosis is an inflammation of the vagina that can result in discharge, itching and pain. The cause is usually a change in the normal balance of vaginal bacteria or an infection. Vaginosis can also result from reduced estrogen levels after menopause.  The most common causes of vaginosis are:   Bacterial vaginosis which results from an overgrowth of one on several organisms that are normally present in your vagina.   Yeast infections which are caused by a naturally occurring fungus called candida.   Vaginal atrophy (atrophic vaginosis) which results from the thinning of the vagina from reduced estrogen levels after menopause.   Trichomoniasis which is caused by a parasite and is commonly transmitted by sexual intercourse.  Factors that increase your risk of developing vaginosis include: Medications, such as antibiotics and steroids Uncontrolled diabetes Use of hygiene products such as bubble bath, vaginal spray or vaginal deodorant Douching Wearing damp or tight-fitting clothing Using an intrauterine device (IUD) for birth control Hormonal changes, such as those associated with pregnancy, birth control pills or menopause Sexual activity Having a sexually transmitted infection  Your treatment plan is Metronidazole or Flagyl 500mg twice a day for 7 days.  I have electronically sent this prescription into the pharmacy that you have chosen.  Be sure to take all of the medication as directed. Stop taking any medication if you develop a rash, tongue swelling or shortness of breath. Mothers who are breast feeding should consider pumping and discarding their breast milk while on these antibiotics. However, there is no consensus that infant exposure at these doses would be harmful.  Remember that  medication creams can weaken latex condoms. .   HOME CARE:  Good hygiene may prevent some types of vaginosis from recurring and may relieve some symptoms:  Avoid baths, hot tubs and whirlpool spas. Rinse soap from your outer genital area after a shower, and dry the area well to prevent irritation. Don't use scented or harsh soaps, such as those with deodorant or antibacterial action. Avoid irritants. These include scented tampons and pads. Wipe from front to back after using the toilet. Doing so avoids spreading fecal bacteria to your vagina.  Other things that may help prevent vaginosis include:  Don't douche. Your vagina doesn't require cleansing other than normal bathing. Repetitive douching disrupts the normal organisms that reside in the vagina and can actually increase your risk of vaginal infection. Douching won't clear up a vaginal infection. Use a latex condom. Both female and female latex condoms may help you avoid infections spread by sexual contact. Wear cotton underwear. Also wear pantyhose with a cotton crotch. If you feel comfortable without it, skip wearing underwear to bed. Yeast thrives in moist environments Your symptoms should improve in the next day or two.  GET HELP RIGHT AWAY IF:  You have pain in your lower abdomen ( pelvic area or over your ovaries) You develop nausea or vomiting You develop a fever Your discharge changes or worsens You have persistent pain with intercourse You develop shortness of breath, a rapid pulse, or you faint.  These symptoms could be signs of problems or infections that need to be evaluated by a medical provider now.  MAKE SURE YOU   Understand these instructions. Will watch your condition. Will get help right   away if you are not doing well or get worse.  Thank you for choosing an e-visit.  Your e-visit answers were reviewed by a board certified advanced clinical practitioner to complete your personal care plan. Depending upon the  condition, your plan could have included both over the counter or prescription medications.  Please review your pharmacy choice. Make sure the pharmacy is open so you can pick up prescription now. If there is a problem, you may contact your provider through [employer] and have the prescription routed to another pharmacy.  Your safety is important to [place]. If you have drug allergies check your prescription carefully.   For the next 24 hours you can use MyChart to ask questions about today's visit, request a non-urgent call back, or ask for a work or school excuse. You will get an email in the next two days asking about your experience. I hope that your e-visit has been valuable and will speed your recovery.   I spent approximately 5 minutes reviewing the patient's history, current symptoms and coordinating their plan of care   Meds ordered this encounter  Medications   metroNIDAZOLE (FLAGYL) 500 MG tablet    Sig: Take 1 tablet (500 mg total) by mouth 2 (two) times daily for 7 days.    Dispense:  14 tablet    Refill:  0

## 2022-05-13 ENCOUNTER — Other Ambulatory Visit: Payer: Self-pay | Admitting: Family

## 2022-05-13 DIAGNOSIS — I10 Essential (primary) hypertension: Secondary | ICD-10-CM

## 2022-06-06 ENCOUNTER — Other Ambulatory Visit: Payer: Self-pay | Admitting: Family

## 2022-06-06 DIAGNOSIS — I10 Essential (primary) hypertension: Secondary | ICD-10-CM

## 2024-03-04 ENCOUNTER — Encounter: Payer: Self-pay | Admitting: Gastroenterology

## 2024-03-14 ENCOUNTER — Encounter: Payer: Self-pay | Admitting: Gastroenterology

## 2024-03-14 NOTE — Progress Notes (Deleted)
 GI Office Note    Referring Provider: Reynolds Somerset, PA-C Primary Care Physician:  Lavell Bari LABOR, FNP  Primary Gastroenterologist:  Chief Complaint   No chief complaint on file.    History of Present Illness   Marissa Blake is a 33 y.o. female presenting today at the request of Reynolds Somerset, PA-C at [facility], for chronic epigastric pain.  Abd u/s 03/2022: normal  06/2023: H.pylor stool antigen negative.       Medications   Current Outpatient Medications  Medication Sig Dispense Refill   busPIRone  (BUSPAR ) 5 MG tablet TAKE 1 TABLET BY MOUTH THREE TIMES A DAY 270 tablet 0   cholecalciferol (VITAMIN D3) 25 MCG (1000 UNIT) tablet Take 1,000 Units by mouth daily.     ferrous sulfate 324 MG TBEC Take 324 mg by mouth.     hydrochlorothiazide  (HYDRODIURIL ) 12.5 MG tablet Take 1 tablet (12.5 mg total) by mouth daily. (NEEDS TO BE SEEN BEFORE NEXT REFILL) 30 tablet 0   sertraline  (ZOLOFT ) 100 MG tablet Take 1 tablet (100 mg total) by mouth daily. 90 tablet 3   No current facility-administered medications for this visit.    Allergies   Allergies as of 03/15/2024 - Review Complete 03/14/2022  Allergen Reaction Noted   Benzoyl peroxide Other (See Comments) 12/23/2014   Latex Other (See Comments) 12/23/2014    Past Medical History   Past Medical History:  Diagnosis Date   Anemia    GERD (gastroesophageal reflux disease)    Heart murmur    Hypertension    was taking HCTZ prior to pregnancy    Past Surgical History   Past Surgical History:  Procedure Laterality Date   CESAREAN SECTION N/A 12/28/2018   Procedure: CESAREAN SECTION;  Surgeon: Kandyce Sor, MD;  Location: [facility] ORS;  Service: Obstetrics;  Laterality: N/A;   none     WISDOM TOOTH EXTRACTION      Past Family History   Family History  Problem Relation Age of Onset   Autism Sister    Hypertension Father     Past Social History   Social History   Socioeconomic History   Marital  status: Married    Spouse name: Not on file   Number of children: Not on file   Years of education: Not on file   Highest education level: Not on file  Occupational History   Not on file  Tobacco Use   Smoking status: Never   Smokeless tobacco: Never  Vaping Use   Vaping status: Never Used  Substance and Sexual Activity   Alcohol use: Not Currently    Comment: social   Drug use: No   Sexual activity: Yes    Birth control/protection: Pill  Other Topics Concern   Not on file  Social History Narrative   Not on file   Social Drivers of Health   Financial Resource Strain: Not on file  Food Insecurity: Not on file  Transportation Needs: Not on file  Physical Activity: Not on file  Stress: No Stress Concern Present (12/28/2018)   [employer] of Occupational Health - Occupational Stress Questionnaire    Feeling of Stress : Not at all  Social Connections: Not on file  Intimate Partner Violence: Not At Risk (12/28/2018)   Humiliation, Afraid, Rape, and Kick questionnaire    Fear of Current or Ex-Partner: No    Emotionally Abused: No    Physically Abused: No    Sexually Abused: No    Review  of Systems   General: Negative for anorexia, weight loss, fever, chills, fatigue, weakness. Eyes: Negative for vision changes.  ENT: Negative for hoarseness, difficulty swallowing , nasal congestion. CV: Negative for chest pain, angina, palpitations, dyspnea on exertion, peripheral edema.  Respiratory: Negative for dyspnea at rest, dyspnea on exertion, cough, sputum, wheezing.  GI: See history of present illness. GU:  Negative for dysuria, hematuria, urinary incontinence, urinary frequency, nocturnal urination.  MS: Negative for joint pain, low back pain.  Derm: Negative for rash or itching.  Neuro: Negative for weakness, abnormal sensation, seizure, frequent headaches, memory loss,  confusion.  Psych: Negative for anxiety, depression, suicidal ideation, hallucinations.  Endo:  Negative for unusual weight change.  Heme: Negative for bruising or bleeding. Allergy: Negative for rash or hives.  Physical Exam   There were no vitals taken for this visit.   General: Well-nourished, well-developed in no acute distress.  Head: Normocephalic, atraumatic.   Eyes: Conjunctiva pink, no icterus. Mouth: Oropharyngeal mucosa moist and pink  Neck: Supple without thyromegaly, masses, or lymphadenopathy.  Lungs: Clear to auscultation bilaterally.  Heart: Regular rate and rhythm, no murmurs rubs or gallops.  Abdomen: Bowel sounds are normal, nontender, nondistended, no hepatosplenomegaly or masses,  no abdominal bruits or hernia, no rebound or guarding.   Rectal: not performed Extremities: No lower extremity edema. No clubbing or deformities.  Neuro: Alert and oriented x 4 , grossly normal neurologically.  Skin: Warm and dry, no rash or jaundice.   Psych: Alert and cooperative, normal mood and affect.  Labs   *** Imaging Studies   No results found.  Assessment/Plan:       PIERRETTE Sonny RAMAN. Ezzard, MHS, PA-C [facility]

## 2024-03-15 ENCOUNTER — Ambulatory Visit: Admitting: Gastroenterology

## 2024-03-15 ENCOUNTER — Telehealth: Payer: Self-pay | Admitting: Gastroenterology

## 2024-03-15 ENCOUNTER — Encounter: Payer: Self-pay | Admitting: Gastroenterology

## 2024-03-15 NOTE — Telephone Encounter (Signed)
 Please send message to referring provider at [facility], PA-C) to let them know patient no showed her new patient appointment. If she calls to reschedule, we will allow her to reschedule.

## 2024-04-06 ENCOUNTER — Encounter: Payer: Self-pay | Admitting: *Deleted

## 2024-04-06 ENCOUNTER — Other Ambulatory Visit: Payer: Self-pay | Admitting: *Deleted

## 2024-04-06 ENCOUNTER — Ambulatory Visit: Payer: PRIVATE HEALTH INSURANCE | Admitting: Gastroenterology

## 2024-04-06 ENCOUNTER — Encounter: Payer: Self-pay | Admitting: Gastroenterology

## 2024-04-06 VITALS — BP 134/85 | HR 96 | Temp 98.4°F | Ht 66.0 in | Wt 227.8 lb

## 2024-04-06 DIAGNOSIS — Z800 Family history of malignant neoplasm of digestive organs: Secondary | ICD-10-CM | POA: Insufficient documentation

## 2024-04-06 DIAGNOSIS — R1013 Epigastric pain: Secondary | ICD-10-CM

## 2024-04-06 DIAGNOSIS — Z83719 Family history of colon polyps, unspecified: Secondary | ICD-10-CM

## 2024-04-06 DIAGNOSIS — K5900 Constipation, unspecified: Secondary | ICD-10-CM | POA: Diagnosis not present

## 2024-04-06 NOTE — H&P (View-Only) (Signed)
 GI Office Note    Referring Provider: Lavell Bari LABOR, FNP Primary Care Physician:  Blanca Reynolds ORN, PA-C  Primary Gastroenterologist: Ozell Hollingshead, MD   Chief Complaint   Chief Complaint  Patient presents with   Abdominal Pain    Having issues with abdominal burning that will last several hours with intermittent nausea.     History of Present Illness   Marissa Blake is a 33 y.o. female presenting today at the request of Reynolds Tanda Blanca, PA-C for chronic epigastric pain.   Started over two years ago. 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 Under a lot of stress could have contributed. Usually postprandial epigastric tenderness. Burning will last up to one hour. Sometimes with water . Any foods. Nothing consistent. Sometimes worse with fasting. No heartburn. Symptoms associated with nausea. Occasional vomiting. Can have few days at a time without symptoms past few weeks much worse. Many women in her family have had their gallbladders removed.  Omeprazole  years ago for heartburn. Was able to come off at some point. Once symptoms started with epigastric burning, she initially was placed on two month course of sucralfate (1-2 months) and with pantoprazole . Seemed to helped some. On pantoprazole  for over a year and noted worsening symptoms last few weeks.   No NSAIDs/ASA.   Constipation over past year. Used to have BM 1-2 times daily. Now 1-2 times weekly. Can be bristol 1/5/6. Drinks flavored water , mostly soda. No melena, brbpr.   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    02/2024: Na 140, K4, Cre 1.05, Tbili 0.7, AP 85, AST 7, ALT 7, A1C 5.2, TSH 2.344  Abd u/s 03/2022: normal exam. Gallbladder remains in situ.  06/2023: Hpylori stool antigen negative   Wt Readings from Last 3 Encounters:  04/06/24 227 lb 12.8 oz (103.3 kg)  08/30/21 229 lb 3.2 oz (104 kg)  11/10/20 256 lb 3.2 oz (116.2 kg)     Medications   Current Outpatient Medications  Medication  Sig Dispense Refill   Biotin 5000 MCG CAPS      buPROPion (WELLBUTRIN XL) 300 MG 24 hr tablet Take 300 mg by mouth every morning.     ferrous sulfate 324 MG TBEC Take 324 mg by mouth.     hydrochlorothiazide  (HYDRODIURIL ) 25 MG tablet Take 25 mg by mouth daily.     pantoprazole  (PROTONIX ) 40 MG tablet Take 40 mg by mouth daily.     phentermine (ADIPEX-P) 37.5 MG tablet Take 37.5 mg by mouth daily before breakfast.     propranolol (INDERAL) 40 MG tablet Take 40 mg by mouth at bedtime.     No current facility-administered medications for this visit.    Allergies   Allergies as of 04/06/2024 - Review Complete 04/06/2024  Allergen Reaction Noted   Benzoyl peroxide Other (See Comments) 12/23/2014   Latex Other (See Comments) 12/23/2014    Past Medical History   Past Medical History:  Diagnosis Date   Anemia    Anxiety    GERD (gastroesophageal reflux disease)    Heart murmur    Hypertension    was taking HCTZ prior to pregnancy   Postpartum depression     Past Surgical History   Past Surgical History:  Procedure Laterality Date   CESAREAN SECTION N/A 12/28/2018   Procedure: CESAREAN SECTION;  Surgeon: Kandyce Sor, MD;  Location: [facility];  Service: Obstetrics;  Laterality: N/A;   WISDOM TOOTH EXTRACTION  Past Family History   Family History  Problem Relation Age of Onset   Cancer Mother        fat cancer   Colonic polyp Mother    Hypertension Father    Autism Sister    Colon cancer Maternal Grandmother 36   Celiac disease Neg Hx    Inflammatory bowel disease Neg Hx     Past Social History   Social History   Socioeconomic History   Marital status: Married    Spouse name: Not on file   Number of children: Not on file   Years of education: Not on file   Highest education level: Not on file  Occupational History   Not on file  Tobacco Use   Smoking status: Never   Smokeless tobacco: Never  Vaping Use   Vaping status: Never Used  Substance and  Sexual Activity   Alcohol use: Not Currently    Comment: social   Drug use: No   Sexual activity: Yes    Birth control/protection: Pill  Other Topics Concern   Not on file  Social History Narrative   Not on file   Social Drivers of Health   Financial Resource Strain: Not on file  Food Insecurity: Not on file  Transportation Needs: Not on file  Physical Activity: Not on file  Stress: No Stress Concern Present (12/28/2018)   [employer] of Occupational Health - Occupational Stress Questionnaire    Feeling of Stress : Not at all  Social Connections: Not on file  Intimate Partner Violence: Not At Risk (12/28/2018)   Humiliation, Afraid, Rape, and Kick questionnaire    Fear of Current or Ex-Partner: No    Emotionally Abused: No    Physically Abused: No    Sexually Abused: No    Review of Systems   General: Negative for anorexia, weight loss, fever, chills, fatigue, weakness. Eyes: Negative for vision changes.  ENT: Negative for hoarseness, difficulty swallowing, nasal congestion. CV: Negative for chest pain, angina, palpitations, dyspnea on exertion, peripheral edema.  Respiratory: Negative for dyspnea at rest, dyspnea on exertion, cough, sputum, wheezing.  GI: See history of present illness. GU:  Negative for dysuria, hematuria, urinary incontinence, urinary frequency, nocturnal urination.  MS: Negative for joint pain, low back pain.  Derm: Negative for rash or itching.  Neuro: Negative for weakness, abnormal sensation, seizure, frequent headaches, memory loss,  confusion.  Psych: Negative for anxiety, depression, suicidal ideation, hallucinations.  Endo: Negative for unusual weight change.  Heme: Negative for bruising or bleeding. Allergy: Negative for rash or hives.  Physical Exam   BP 134/85 (BP Location: Right Arm, Patient Position: Sitting, Cuff Size: Large)   Pulse 96   Temp 98.4 F (36.9 C) (Oral)   Ht 5' 6 (1.676 m)   Wt 227 lb 12.8 oz (103.3 kg)   LMP   (LMP Unknown)   SpO2 95%   Breastfeeding No   BMI 36.77 kg/m    General: Well-nourished, well-developed in no acute distress.  Head: Normocephalic, atraumatic.   Eyes: Conjunctiva pink, no icterus. Mouth: Oropharyngeal mucosa moist and pink  Neck: Supple without thyromegaly, masses, or lymphadenopathy.  Lungs: Clear to auscultation bilaterally.  Heart: Regular rate and rhythm, no murmurs rubs or gallops.  Abdomen: Bowel sounds are normal, nontender, nondistended, no hepatosplenomegaly or masses,  no abdominal bruits or hernia, no rebound or guarding.   Rectal: not performed Extremities: No lower extremity edema. No clubbing or deformities.  Neuro: Alert and oriented x 4 ,  grossly normal neurologically.  Skin: Warm and dry, no rash or jaundice.   Psych: Alert and cooperative, normal mood and affect.  Labs   See hpi  Imaging Studies   No results found.  Assessment/Plan:   Epigastric burning: worse postprandially but also with fasting. Chronic PPI has not adequately controlled her symptoms. She has had gallbladder work up a couple of different times. Last u/s in 2023. Last HIDA remotely. Her 51 yo son had H.pylori. Patient had negative h.pylori stool antigen but suspect she was also on PPI at that time.  -EGD with Dr. Shaaron. ASA 2. I have discussed the risks, alternatives, benefits with regards to but not limited to the risk of reaction to medication, bleeding, infection, perforation and the patient is agreeable to proceed. Written consent to be obtained. -Needs BMET, pregnancy test.   -if any concern for H.pylori after EGD, we can retest off PPI.  -if EGD unremarkable, consider gb work up.  Constipation: noted over the past one year. Mother has had colon polyps but unclear at what age and if precancerous. Maternal GM had colon cancer age 79.  -start miralax one capful daily -patient to find out more about her mother's history regarding polyps. Patient aware that if mother had  precancerous polyps, patient would need colonoscopy starting 10 years before age of diagnosis for her mother or at age 6 at the latest.       Sonny RAMAN. Ezzard, MHS, PA-C [facility]

## 2024-04-06 NOTE — Patient Instructions (Signed)
 Continue pantoprazole  40mg  daily before breakfast.   Upper endoscopy to be scheduled.   I have provided gastritis/GERD information, dietary changes for these conditions could be helpful.

## 2024-04-06 NOTE — Progress Notes (Signed)
 GI Office Note    Referring Provider: Lavell Bari LABOR, FNP Primary Care Physician:  Blanca Reynolds ORN, PA-C  Primary Gastroenterologist: Ozell Hollingshead, MD   Chief Complaint   Chief Complaint  Patient presents with   Abdominal Pain    Having issues with abdominal burning that will last several hours with intermittent nausea.     History of Present Illness   Marissa Blake is a 33 y.o. female presenting today at the request of Reynolds Tanda Blanca, PA-C for chronic epigastric pain.   Started over two years ago. 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 Under a lot of stress could have contributed. Usually postprandial epigastric tenderness. Burning will last up to one hour. Sometimes with water . Any foods. Nothing consistent. Sometimes worse with fasting. No heartburn. Symptoms associated with nausea. Occasional vomiting. Can have few days at a time without symptoms past few weeks much worse. Many women in her family have had their gallbladders removed.  Omeprazole  years ago for heartburn. Was able to come off at some point. Once symptoms started with epigastric burning, she initially was placed on two month course of sucralfate (1-2 months) and with pantoprazole . Seemed to helped some. On pantoprazole  for over a year and noted worsening symptoms last few weeks.   No NSAIDs/ASA.   Constipation over past year. Used to have BM 1-2 times daily. Now 1-2 times weekly. Can be bristol 1/5/6. Drinks flavored water , mostly soda. No melena, brbpr.   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    02/2024: Na 140, K4, Cre 1.05, Tbili 0.7, AP 85, AST 7, ALT 7, A1C 5.2, TSH 2.344  Abd u/s 03/2022: normal exam. Gallbladder remains in situ.  06/2023: Hpylori stool antigen negative   Wt Readings from Last 3 Encounters:  04/06/24 227 lb 12.8 oz (103.3 kg)  08/30/21 229 lb 3.2 oz (104 kg)  11/10/20 256 lb 3.2 oz (116.2 kg)     Medications   Current Outpatient Medications  Medication  Sig Dispense Refill   Biotin 5000 MCG CAPS      buPROPion (WELLBUTRIN XL) 300 MG 24 hr tablet Take 300 mg by mouth every morning.     ferrous sulfate 324 MG TBEC Take 324 mg by mouth.     hydrochlorothiazide  (HYDRODIURIL ) 25 MG tablet Take 25 mg by mouth daily.     pantoprazole  (PROTONIX ) 40 MG tablet Take 40 mg by mouth daily.     phentermine (ADIPEX-P) 37.5 MG tablet Take 37.5 mg by mouth daily before breakfast.     propranolol (INDERAL) 40 MG tablet Take 40 mg by mouth at bedtime.     No current facility-administered medications for this visit.    Allergies   Allergies as of 04/06/2024 - Review Complete 04/06/2024  Allergen Reaction Noted   Benzoyl peroxide Other (See Comments) 12/23/2014   Latex Other (See Comments) 12/23/2014    Past Medical History   Past Medical History:  Diagnosis Date   Anemia    Anxiety    GERD (gastroesophageal reflux disease)    Heart murmur    Hypertension    was taking HCTZ prior to pregnancy   Postpartum depression     Past Surgical History   Past Surgical History:  Procedure Laterality Date   CESAREAN SECTION N/A 12/28/2018   Procedure: CESAREAN SECTION;  Surgeon: Kandyce Sor, MD;  Location: [facility] LD ORS;  Service: Obstetrics;  Laterality: N/A;   WISDOM TOOTH EXTRACTION  Past Family History   Family History  Problem Relation Age of Onset   Cancer Mother        fat cancer   Colonic polyp Mother    Hypertension Father    Autism Sister    Colon cancer Maternal Grandmother 36   Celiac disease Neg Hx    Inflammatory bowel disease Neg Hx     Past Social History   Social History   Socioeconomic History   Marital status: Married    Spouse name: Not on file   Number of children: Not on file   Years of education: Not on file   Highest education level: Not on file  Occupational History   Not on file  Tobacco Use   Smoking status: Never   Smokeless tobacco: Never  Vaping Use   Vaping status: Never Used  Substance and  Sexual Activity   Alcohol use: Not Currently    Comment: social   Drug use: No   Sexual activity: Yes    Birth control/protection: Pill  Other Topics Concern   Not on file  Social History Narrative   Not on file   Social Drivers of Health   Financial Resource Strain: Not on file  Food Insecurity: Not on file  Transportation Needs: Not on file  Physical Activity: Not on file  Stress: No Stress Concern Present (12/28/2018)   [employer] of Occupational Health - Occupational Stress Questionnaire    Feeling of Stress : Not at all  Social Connections: Not on file  Intimate Partner Violence: Not At Risk (12/28/2018)   Humiliation, Afraid, Rape, and Kick questionnaire    Fear of Current or Ex-Partner: No    Emotionally Abused: No    Physically Abused: No    Sexually Abused: No    Review of Systems   General: Negative for anorexia, weight loss, fever, chills, fatigue, weakness. Eyes: Negative for vision changes.  ENT: Negative for hoarseness, difficulty swallowing, nasal congestion. CV: Negative for chest pain, angina, palpitations, dyspnea on exertion, peripheral edema.  Respiratory: Negative for dyspnea at rest, dyspnea on exertion, cough, sputum, wheezing.  GI: See history of present illness. GU:  Negative for dysuria, hematuria, urinary incontinence, urinary frequency, nocturnal urination.  MS: Negative for joint pain, low back pain.  Derm: Negative for rash or itching.  Neuro: Negative for weakness, abnormal sensation, seizure, frequent headaches, memory loss,  confusion.  Psych: Negative for anxiety, depression, suicidal ideation, hallucinations.  Endo: Negative for unusual weight change.  Heme: Negative for bruising or bleeding. Allergy: Negative for rash or hives.  Physical Exam   BP 134/85 (BP Location: Right Arm, Patient Position: Sitting, Cuff Size: Large)   Pulse 96   Temp 98.4 F (36.9 C) (Oral)   Ht 5' 6 (1.676 m)   Wt 227 lb 12.8 oz (103.3 kg)   LMP   (LMP Unknown)   SpO2 95%   Breastfeeding No   BMI 36.77 kg/m    General: Well-nourished, well-developed in no acute distress.  Head: Normocephalic, atraumatic.   Eyes: Conjunctiva pink, no icterus. Mouth: Oropharyngeal mucosa moist and pink  Neck: Supple without thyromegaly, masses, or lymphadenopathy.  Lungs: Clear to auscultation bilaterally.  Heart: Regular rate and rhythm, no murmurs rubs or gallops.  Abdomen: Bowel sounds are normal, nontender, nondistended, no hepatosplenomegaly or masses,  no abdominal bruits or hernia, no rebound or guarding.   Rectal: not performed Extremities: No lower extremity edema. No clubbing or deformities.  Neuro: Alert and oriented x 4 ,  grossly normal neurologically.  Skin: Warm and dry, no rash or jaundice.   Psych: Alert and cooperative, normal mood and affect.  Labs   See hpi  Imaging Studies   No results found.  Assessment/Plan:   Epigastric burning: worse postprandially but also with fasting. Chronic PPI has not adequately controlled her symptoms. She has had gallbladder work up a couple of different times. Last u/s in 2023. Last HIDA remotely. Her 51 yo son had H.pylori. Patient had negative h.pylori stool antigen but suspect she was also on PPI at that time.  -EGD with Dr. Shaaron. ASA 2. I have discussed the risks, alternatives, benefits with regards to but not limited to the risk of reaction to medication, bleeding, infection, perforation and the patient is agreeable to proceed. Written consent to be obtained. -Needs BMET, pregnancy test.   -if any concern for H.pylori after EGD, we can retest off PPI.  -if EGD unremarkable, consider gb work up.  Constipation: noted over the past one year. Mother has had colon polyps but unclear at what age and if precancerous. Maternal GM had colon cancer age 79.  -start miralax one capful daily -patient to find out more about her mother's history regarding polyps. Patient aware that if mother had  precancerous polyps, patient would need colonoscopy starting 10 years before age of diagnosis for her mother or at age 4 at the latest.       Sonny RAMAN. Ezzard, MHS, PA-C [facility]

## 2024-04-06 NOTE — Telephone Encounter (Signed)
 PA approved via [facility] for EGD Order ID: [id number]       Authorized Approval Valid Through: 04/06/2024 - 06/04/2024

## 2024-04-08 ENCOUNTER — Other Ambulatory Visit (HOSPITAL_COMMUNITY)
Admission: RE | Admit: 2024-04-08 | Discharge: 2024-04-08 | Disposition: A | Discharge status: Home / Self Care | Source: Ambulatory Visit | Attending: Internal Medicine | Admitting: Internal Medicine

## 2024-04-08 ENCOUNTER — Other Ambulatory Visit: Payer: Self-pay

## 2024-04-08 ENCOUNTER — Encounter (HOSPITAL_COMMUNITY)
Admission: RE | Admit: 2024-04-08 | Discharge: 2024-04-08 | Disposition: A | Discharge status: Home / Self Care | Payer: Self-pay | Source: Ambulatory Visit | Attending: Internal Medicine | Admitting: Internal Medicine

## 2024-04-08 ENCOUNTER — Encounter (HOSPITAL_COMMUNITY): Payer: Self-pay

## 2024-04-08 DIAGNOSIS — R1013 Epigastric pain: Secondary | ICD-10-CM | POA: Insufficient documentation

## 2024-04-08 DIAGNOSIS — Z01812 Encounter for preprocedural laboratory examination: Secondary | ICD-10-CM | POA: Diagnosis present

## 2024-04-08 LAB — BASIC METABOLIC PANEL WITH GFR
Anion gap: 11 (ref 5–15)
BUN: 14 mg/dL (ref 6–20)
CO2: 29 mmol/L (ref 22–32)
Calcium: 9.1 mg/dL (ref 8.9–10.3)
Chloride: 99 mmol/L (ref 98–111)
Creatinine, Ser: 0.91 mg/dL (ref 0.44–1.00)
GFR, Estimated: >60 mL/min (ref >60)
Glucose, Bld: 83 mg/dL (ref 70–99)
Potassium: 3.3 mmol/L — ABNORMAL LOW (ref 3.5–5.1)
Sodium: 139 mmol/L (ref 135–145)

## 2024-04-08 LAB — PREGNANCY, URINE: Preg Test, Ur: NEGATIVE

## 2024-04-12 ENCOUNTER — Encounter (HOSPITAL_COMMUNITY)
Admission: RE | Disposition: A | Discharge status: Home / Self Care | Payer: Self-pay | Source: Home / Self Care | Attending: Internal Medicine

## 2024-04-12 ENCOUNTER — Other Ambulatory Visit: Payer: Self-pay

## 2024-04-12 ENCOUNTER — Ambulatory Visit (HOSPITAL_COMMUNITY): Admitting: Anesthesiology

## 2024-04-12 ENCOUNTER — Encounter (HOSPITAL_COMMUNITY): Payer: Self-pay | Admitting: Internal Medicine

## 2024-04-12 ENCOUNTER — Ambulatory Visit (HOSPITAL_COMMUNITY)
Admission: RE | Admit: 2024-04-12 | Discharge: 2024-04-12 | Disposition: A | Discharge status: Home / Self Care | Payer: Self-pay | Attending: Internal Medicine | Admitting: Internal Medicine

## 2024-04-12 DIAGNOSIS — K219 Gastro-esophageal reflux disease without esophagitis: Secondary | ICD-10-CM | POA: Diagnosis not present

## 2024-04-12 DIAGNOSIS — Z800 Family history of malignant neoplasm of digestive organs: Secondary | ICD-10-CM | POA: Diagnosis not present

## 2024-04-12 DIAGNOSIS — Z83719 Family history of colon polyps, unspecified: Secondary | ICD-10-CM | POA: Diagnosis not present

## 2024-04-12 DIAGNOSIS — I10 Essential (primary) hypertension: Secondary | ICD-10-CM | POA: Diagnosis not present

## 2024-04-12 DIAGNOSIS — F419 Anxiety disorder, unspecified: Secondary | ICD-10-CM | POA: Diagnosis not present

## 2024-04-12 DIAGNOSIS — F32A Depression, unspecified: Secondary | ICD-10-CM | POA: Diagnosis not present

## 2024-04-12 DIAGNOSIS — R1013 Epigastric pain: Secondary | ICD-10-CM | POA: Diagnosis present

## 2024-04-12 DIAGNOSIS — K5900 Constipation, unspecified: Secondary | ICD-10-CM | POA: Insufficient documentation

## 2024-04-12 DIAGNOSIS — K317 Polyp of stomach and duodenum: Secondary | ICD-10-CM | POA: Insufficient documentation

## 2024-04-12 DIAGNOSIS — Z79899 Other long term (current) drug therapy: Secondary | ICD-10-CM | POA: Diagnosis not present

## 2024-04-12 HISTORY — PX: ESOPHAGOGASTRODUODENOSCOPY: SHX5428

## 2024-04-12 SURGERY — EGD (ESOPHAGOGASTRODUODENOSCOPY)
Anesthesia: General

## 2024-04-12 MED ORDER — STERILE WATER FOR IRRIGATION IR SOLN
Status: DC | PRN
  Administered 2024-04-12: 60 mL

## 2024-04-12 MED ORDER — LIDOCAINE 2% (20 MG/ML) 5 ML SYRINGE
INTRAMUSCULAR | Status: DC | PRN
Start: 2024-04-12 — End: 2024-04-12
  Administered 2024-04-12: 100 mg via INTRAVENOUS

## 2024-04-12 MED ORDER — LACTATED RINGERS IV SOLN: INTRAVENOUS | Status: DC | PRN

## 2024-04-12 MED ORDER — PROPOFOL 10 MG/ML IV BOLUS
INTRAVENOUS | Status: DC | PRN
  Administered 2024-04-12 (×4): 50 mg via INTRAVENOUS

## 2024-04-12 NOTE — Op Note (Signed)
 [facility] Patient Name: Marissa Blake Procedure Date: 04/12/2024 12:00 PM MRN: [id number] Date of Birth: May 17, 1991 Attending MD: Lamar Ozell Hollingshead , MD, [id number] CSN: [id number] Age: 33 Admit Type: Outpatient Procedure:                Upper GI endoscopy Indications:              Dyspepsia; 74-year-old autistic son had H. pylori                            which was treated Providers:                Lamar Ozell Hollingshead, MD, Devere Lodge, Jon Loge Referring MD:              Medicines:                Propofol  per Anesthesia Complications:            No immediate complications. Estimated Blood Loss:     Estimated blood loss was minimal. Procedure:                Pre-Anesthesia Assessment:                           - Prior to the procedure, a History and Physical                            was performed, and patient medications and                            allergies were reviewed. The patient's tolerance of                            previous anesthesia was also reviewed. The risks                            and benefits of the procedure and the sedation                            options and risks were discussed with the patient.                            All questions were answered, and informed consent                            was obtained. Prior Anticoagulants: The patient has                            taken no anticoagulant or antiplatelet agents. ASA                            Grade Assessment: III - A patient with severe  systemic disease. After reviewing the risks and                            benefits, the patient was deemed in satisfactory                            condition to undergo the procedure.                           After obtaining informed consent, the endoscope was                            passed under direct vision. Throughout the                            procedure, the patient's blood  pressure, pulse, and                            oxygen saturations were monitored continuously. The                            [id number]-H190 ([id number]) scope was introduced through the                            mouth, and advanced to the second part of duodenum.                            The upper GI endoscopy was accomplished without                            difficulty. The patient tolerated the procedure                            well. Scope In: 12:20:28 PM Scope Out: 12:25:50 PM Total Procedure Duration: 0 hours 5 minutes 22 seconds  Findings:      The examined esophagus was normal.      Stomach empty. Mild patchy erythema diffusely. No ulcer or infiltrating       process. (1) 3 mm polyp on the gastric body.      The duodenal bulb and second portion of the duodenum were normal. Biopsy       of the abnormal gastric mucosa taken for histologic study. Also the       polyp was removed with cold biopsy technique. Impression:               - Normal esophagus. Patchy gastric                            erythema?"biopsied. Gastric polyp?"removed as                            described above                           - Normal duodenal bulb and second portion of the  duodenum.                           . Moderate Sedation:      Moderate (conscious) sedation was personally administered by an       anesthesia professional. The following parameters were monitored: oxygen       saturation, heart rate, blood pressure, respiratory rate, EKG, adequacy       of pulmonary ventilation, and response to care. Recommendation:           - Patient has a contact number available for                            emergencies. The signs and symptoms of potential                            delayed complications were discussed with the                            patient. Return to normal activities tomorrow.                            Written discharge instructions were provided to the                             patient.                           - Advance diet as tolerated. Follow-up on                            pathology. Further recommendations to follow.                           - Procedure Code(s):        --- Professional ---                           [phone], Esophagogastroduodenoscopy, flexible,                            transoral; diagnostic, including collection of                            specimen(s) by brushing or washing, when performed                            (separate procedure) Diagnosis Code(s):        --- Professional ---                           R10.13, Epigastric pain CPT copyright 2022 American Medical Association. All rights reserved. The codes documented in this report are preliminary and upon coder review may  be revised to meet current compliance requirements. Lamar HERO. Emmanuela Ghazi, MD Lamar Ozell Hollingshead, MD 04/12/2024 12:34:08 PM This report has been signed electronically. Number of Addenda: 0

## 2024-04-12 NOTE — Anesthesia Preprocedure Evaluation (Addendum)
 Anesthesia Evaluation  Patient identified by MRN, date of birth, ID band Patient awake    Reviewed: Allergy & Precautions, H&P , NPO status , Patient's Chart, lab work & pertinent test results  Airway Mallampati: II  TM Distance: >3 FB Neck ROM: Full    Dental no notable dental hx.    Pulmonary neg pulmonary ROS   Pulmonary exam normal breath sounds clear to auscultation       Cardiovascular hypertension, Normal cardiovascular exam+ Valvular Problems/Murmurs  Rhythm:Regular Rate:Normal     Neuro/Psych  PSYCHIATRIC DISORDERS Anxiety Depression    negative neurological ROS     GI/Hepatic Neg liver ROS,GERD  ,,  Endo/Other  negative endocrine ROS    Renal/GU negative Renal ROS  negative genitourinary   Musculoskeletal negative musculoskeletal ROS (+)    Abdominal   Peds negative pediatric ROS (+)  Hematology  (+) Blood dyscrasia, anemia   Anesthesia Other Findings   Reproductive/Obstetrics negative OB ROS                              Anesthesia Physical Anesthesia Plan  ASA: 2  Anesthesia Plan: General   Post-op Pain Management:    Induction: Intravenous  PONV Risk Score and Plan: Propofol  infusion  Airway Management Planned: Nasal Cannula  Additional Equipment:   Intra-op Plan:   Post-operative Plan:   Informed Consent: I have reviewed the patients History and Physical, chart, labs and discussed the procedure including the risks, benefits and alternatives for the proposed anesthesia with the patient or authorized representative who has indicated his/her understanding and acceptance.     Dental advisory given  Plan Discussed with: CRNA  Anesthesia Plan Comments:          Anesthesia Quick Evaluation

## 2024-04-12 NOTE — Telephone Encounter (Signed)
Medication has been updated in chart

## 2024-04-12 NOTE — Anesthesia Postprocedure Evaluation (Signed)
 Anesthesia Post Note  Patient: Marissa Blake  Procedure(s) Performed: EGD (ESOPHAGOGASTRODUODENOSCOPY)  Patient location during evaluation: PACU Anesthesia Type: General Level of consciousness: awake and alert Pain management: pain level controlled Vital Signs Assessment: post-procedure vital signs reviewed and stable Respiratory status: spontaneous breathing, nonlabored ventilation, respiratory function stable and patient connected to nasal cannula oxygen Cardiovascular status: stable and blood pressure returned to baseline Postop Assessment: no apparent nausea or vomiting Anesthetic complications: no   No notable events documented.   Last Vitals:  Vitals:   04/12/24 1231 04/12/24 1235  BP: 103/67   Pulse: 88   Resp: 19   Temp: 36.8 C 36.8 C  SpO2: 100%     Last Pain:  Vitals:   04/12/24 1231  TempSrc: Oral  PainSc: 0-No pain                 Andrea Limes

## 2024-04-12 NOTE — Transfer of Care (Signed)
 Immediate Anesthesia Transfer of Care Note  Patient: Marissa Blake  Procedure(s) Performed: EGD (ESOPHAGOGASTRODUODENOSCOPY)  Patient Location: Short Stay  Anesthesia Type:General  Level of Consciousness: awake, alert , oriented, and patient cooperative  Airway & Oxygen Therapy: Patient Spontanous Breathing  Post-op Assessment: Report given to RN, Post -op Vital signs reviewed and stable, and Patient moving all extremities X 4  Post vital signs: Reviewed and stable  Last Vitals:  Vitals Value Taken Time  BP    Temp    Pulse    Resp    SpO2      Last Pain:  Vitals:   04/12/24 1215  TempSrc:   PainSc: 0-No pain         Complications: No notable events documented.

## 2024-04-12 NOTE — Interval H&P Note (Signed)
 History and Physical Interval Note:  04/12/2024 12:06 PM  Marissa Blake  has presented today for surgery, with the diagnosis of EPIGASTRIC PAIN.  The various methods of treatment have been discussed with the patient and family. After consideration of risks, benefits and other options for treatment, the patient has consented to  Procedure(s) with comments: EGD (ESOPHAGOGASTRODUODENOSCOPY) (N/A) - 11:00AM, ASA 2 as a surgical intervention.  The patient's history has been reviewed, patient examined, no change in status, stable for surgery.  I have reviewed the patient's chart and labs.  Questions were answered to the patient's satisfaction.     Marissa Blake    no change.  No dysphagia.  PPIs have not helped her dyspepsia.  Son with autism found to have H. pylori at age 56 treated.  Diagnostic EGD today per plan. The risks, benefits, limitations, alternatives and imponderables have been reviewed with the patient. Potential for esophageal dilation, biopsy, etc. have also been reviewed.  Questions have been answered. All parties agreeable.

## 2024-04-12 NOTE — Discharge Instructions (Addendum)
 EGD Discharge instructions Please read the instructions outlined below and refer to this sheet in the next few weeks. These discharge instructions provide you with general information on caring for yourself after you leave the hospital. Your doctor may also give you specific instructions. While your treatment has been planned according to the most current medical practices available, unavoidable complications occasionally occur. If you have any problems or questions after discharge, please call your doctor. ACTIVITY You may resume your regular activity but move at a slower pace for the next 24 hours.  Take frequent rest periods for the next 24 hours.  Walking will help expel (get rid of) the air and reduce the bloated feeling in your abdomen.  No driving for 24 hours (because of the anesthesia (medicine) used during the test).  You may shower.  Do not sign any important legal documents or operate any machinery for 24 hours (because of the anesthesia used during the test).  NUTRITION Drink plenty of fluids.  You may resume your normal diet.  Begin with a light meal and progress to your normal diet.  Avoid alcoholic beverages for 24 hours or as instructed by your caregiver.  MEDICATIONS You may resume your normal medications unless your caregiver tells you otherwise.  WHAT YOU CAN EXPECT TODAY You may experience abdominal discomfort such as a feeling of fullness or "gas" pains.  FOLLOW-UP Your doctor will discuss the results of your test with you.  SEEK IMMEDIATE MEDICAL ATTENTION IF ANY OF THE FOLLOWING OCCUR: Excessive nausea (feeling sick to your stomach) and/or vomiting.  Severe abdominal pain and distention (swelling).  Trouble swallowing.  Temperature over 101 F (37.8 C).  Rectal bleeding or vomiting of blood.     Your stomach appeared slightly inflamed.  Biopsies taken.    You had 1 small polyp which I also removed.   further recommendations to follow pending review of  pathology report  At patient request, I called Will Gentry at [phone]-[phone] rolled to voicemail.  Left a detailed message.

## 2024-04-13 ENCOUNTER — Encounter (HOSPITAL_COMMUNITY): Payer: Self-pay | Admitting: Internal Medicine

## 2024-04-13 LAB — SURGICAL PATHOLOGY

## 2024-04-15 ENCOUNTER — Ambulatory Visit: Payer: Self-pay | Admitting: Internal Medicine

## 2024-05-17 NOTE — Telephone Encounter (Signed)
 We can go ahead and schedule colonoscopy (high risk screening, mother with high risk polyps first removed age 33).  -Hold ferrous sulfate one week beofre -Hold phentermine 7 days -Needs preg test, bmet (hydrochlorothiazide )?? -use bisacodyl 15mg  daily for 3 days before -continue miralax daily right now

## 2024-05-18 NOTE — Telephone Encounter (Signed)
 Rourk ASA 2

## 2024-05-18 NOTE — Telephone Encounter (Signed)
 What provider and ASA?

## 2024-05-19 ENCOUNTER — Encounter: Payer: Self-pay | Admitting: *Deleted

## 2024-05-19 ENCOUNTER — Other Ambulatory Visit: Payer: Self-pay | Admitting: *Deleted

## 2024-05-19 DIAGNOSIS — Z1212 Encounter for screening for malignant neoplasm of rectum: Secondary | ICD-10-CM

## 2024-05-19 MED ORDER — PEG 3350-KCL-NA BICARB-NACL 420 G PO SOLR: 4000.0000 mL | Freq: Once | ORAL | 0 refills | Status: AC

## 2024-05-21 ENCOUNTER — Other Ambulatory Visit: Payer: Self-pay | Admitting: Medical Genetics

## 2024-05-25 ENCOUNTER — Other Ambulatory Visit (HOSPITAL_COMMUNITY)
Admission: RE | Admit: 2024-05-25 | Discharge: 2024-05-25 | Disposition: A | Discharge status: Home / Self Care | Payer: Self-pay | Source: Ambulatory Visit | Attending: Medical Genetics | Admitting: Medical Genetics

## 2024-05-26 ENCOUNTER — Other Ambulatory Visit (HOSPITAL_COMMUNITY)

## 2024-05-27 ENCOUNTER — Encounter (HOSPITAL_COMMUNITY)
Admission: RE | Disposition: A | Discharge status: Home / Self Care | Payer: Self-pay | Source: Home / Self Care | Attending: Internal Medicine

## 2024-05-27 ENCOUNTER — Ambulatory Visit (HOSPITAL_COMMUNITY)
Admission: RE | Admit: 2024-05-27 | Discharge: 2024-05-27 | Disposition: A | Discharge status: Home / Self Care | Attending: Internal Medicine | Admitting: Internal Medicine

## 2024-05-27 ENCOUNTER — Other Ambulatory Visit: Payer: Self-pay

## 2024-05-27 ENCOUNTER — Ambulatory Visit (HOSPITAL_COMMUNITY): Admitting: Certified Registered"

## 2024-05-27 ENCOUNTER — Encounter (HOSPITAL_COMMUNITY): Payer: Self-pay | Admitting: Internal Medicine

## 2024-05-27 DIAGNOSIS — I10 Essential (primary) hypertension: Secondary | ICD-10-CM | POA: Insufficient documentation

## 2024-05-27 DIAGNOSIS — Z83719 Family history of colon polyps, unspecified: Secondary | ICD-10-CM | POA: Diagnosis not present

## 2024-05-27 DIAGNOSIS — F419 Anxiety disorder, unspecified: Secondary | ICD-10-CM | POA: Insufficient documentation

## 2024-05-27 DIAGNOSIS — F32A Depression, unspecified: Secondary | ICD-10-CM | POA: Insufficient documentation

## 2024-05-27 DIAGNOSIS — Z1211 Encounter for screening for malignant neoplasm of colon: Secondary | ICD-10-CM | POA: Diagnosis not present

## 2024-05-27 DIAGNOSIS — Z8371 Family history of adenomatous and serrated polyps: Secondary | ICD-10-CM

## 2024-05-27 DIAGNOSIS — K219 Gastro-esophageal reflux disease without esophagitis: Secondary | ICD-10-CM | POA: Diagnosis not present

## 2024-05-27 HISTORY — PX: COLONOSCOPY: SHX5424

## 2024-05-27 LAB — POCT PREGNANCY, URINE: Preg Test, Ur: NEGATIVE

## 2024-05-27 SURGERY — COLONOSCOPY
Anesthesia: General

## 2024-05-27 MED ORDER — LACTATED RINGERS IV SOLN: INTRAVENOUS | Status: DC

## 2024-05-27 MED ORDER — LACTATED RINGERS IV SOLN: INTRAVENOUS | Status: DC | PRN

## 2024-05-27 MED ORDER — PROPOFOL 500 MG/50ML IV EMUL
INTRAVENOUS | Status: DC | PRN
  Administered 2024-05-27: 150 ug/kg/min via INTRAVENOUS

## 2024-05-27 NOTE — Op Note (Signed)
 [facility] Patient Name: Marissa Blake Procedure Date: 05/27/2024 1:58 PM MRN: [id number] Date of Birth: [birth year] Attending MD: Lamar Ozell Hollingshead , MD, [id number] CSN: [id number] Age: 33 Admit Type: Outpatient Procedure:                Colonoscopy Indications:              Colon cancer screening in patient at increased                            risk: Family history of 1st-degree relative with                            colon polyps before age 61 years Providers:                Lamar Ozell Hollingshead, MD, Leandrew Edelman RN, RN,                            Dorcas Lenis, Technician Referring MD:              Medicines:                Propofol  per Anesthesia Complications:            No immediate complications. Estimated Blood Loss:     Estimated blood loss: none. Procedure:                Pre-Anesthesia Assessment:                           - Prior to the procedure, a History and Physical                            was performed, and patient medications and                            allergies were reviewed. The patient's tolerance of                            previous anesthesia was also reviewed. The risks                            and benefits of the procedure and the sedation                            options and risks were discussed with the patient.                            All questions were answered, and informed consent                            was obtained. Prior Anticoagulants: The patient has                            taken no anticoagulant or antiplatelet agents. ASA  Grade Assessment: III - A patient with severe                            systemic disease. After reviewing the risks and                            benefits, the patient was deemed in satisfactory                            condition to undergo the procedure.                           After obtaining informed consent, the colonoscope                            was passed  under direct vision. Throughout the                            procedure, the patient's blood pressure, pulse, and                            oxygen saturations were monitored continuously. The                            [id number] ([id number]) Colon was introduced through                            the anus and advanced to the 5 cm into the ileum.                            The colonoscopy was performed without difficulty. Scope In: 2:17:09 PM Scope Out: 2:27:57 PM Scope Withdrawal Time: 0 hours 6 minutes 45 seconds  Total Procedure Duration: 0 hours 10 minutes 48 seconds  Findings:      The perianal and digital rectal examinations were normal. Distal 10 cm       of TI appeared normal.      The colon (entire examined portion) appeared normal.      The retroflexed view of the distal rectum and anal verge was normal and       showed no anal or rectal abnormalities. Impression:               - The entire examined colon is normal. Distal TI                            appeared normal.                           - The distal rectum and anal verge are normal on                            retroflexion view.                           - No specimens collected. Moderate Sedation:      Moderate (conscious)  sedation was personally administered by an       anesthesia professional. The following parameters were monitored: oxygen       saturation, heart rate, blood pressure, respiratory rate, EKG, adequacy       of pulmonary ventilation, and response to care. Recommendation:           - Patient has a contact number available for                            emergencies. The signs and symptoms of potential                            delayed complications were discussed with the                            patient. Return to normal activities tomorrow.                            Written discharge instructions were provided to the                            patient.                           - Advance diet as  tolerated. Repeat screening                            colonoscopy in 5 years. Procedure Code(s):        --- Professional ---                           [phone], Colonoscopy, flexible; diagnostic, including                            collection of specimen(s) by brushing or washing,                            when performed (separate procedure) Diagnosis Code(s):        --- Professional ---                           Z83.71, Family history of colonic polyps CPT copyright 2022 American Medical Association. All rights reserved. The codes documented in this report are preliminary and upon coder review may  be revised to meet current compliance requirements. Lamar HERO. Tristian Bouska, MD Lamar Ozell Hollingshead, MD 05/27/2024 2:42:03 PM This report has been signed electronically. Number of Addenda: 0

## 2024-05-27 NOTE — H&P (Signed)
 @LOGO @   Gastroenterology Progress Note    Primary Care Physician:  Blanca Reynolds ORN, PA-C Primary Gastroenterologist:  Dr. Shaaron  Pre-Procedure History & Physical: HPI:  Marissa Blake is a 33 y.o. female here  for high rescreening colonoscopy mother with high risk polyps removed at age 47.  Currently no GI symptoms.  Past Medical History:  Diagnosis Date   Anemia    Anxiety    GERD (gastroesophageal reflux disease)    Heart murmur    Hypertension    was taking HCTZ prior to pregnancy   Postpartum depression     Past Surgical History:  Procedure Laterality Date   CESAREAN SECTION N/A 12/28/2018   Procedure: CESAREAN SECTION;  Surgeon: Kandyce Sor, MD;  Location: [facility] LD ORS;  Service: Obstetrics;  Laterality: N/A;   ESOPHAGOGASTRODUODENOSCOPY N/A 04/12/2024   Procedure: EGD (ESOPHAGOGASTRODUODENOSCOPY);  Surgeon: Shaaron Lamar HERO, MD;  Location: [facility] ENDO SUITE;  Service: Endoscopy;  Laterality: N/A;  11:00AM, ASA 2   WISDOM TOOTH EXTRACTION      Prior to Admission medications   Medication Sig Start Date End Date Taking? Authorizing Provider  buPROPion (WELLBUTRIN XL) 300 MG 24 hr tablet Take 300 mg by mouth every morning. 12/12/23  Yes [provider]  hydrochlorothiazide  (HYDRODIURIL ) 25 MG tablet Take 25 mg by mouth daily. 03/02/24  Yes [provider]  phentermine (ADIPEX-P) 37.5 MG tablet Take 37.5 mg by mouth daily before breakfast. 01/09/24  Yes [provider]  propranolol (INDERAL) 40 MG tablet Take 40 mg by mouth at bedtime. 02/03/24  Yes [provider]  Biotin 5000 MCG CAPS  01/09/24   [provider]  ferrous sulfate 324 MG TBEC Take 324 mg by mouth.    [provider]  omeprazole  (PRILOSEC) 20 MG capsule Take 20 mg by mouth daily.    [provider]    Allergies as of 05/19/2024 - Review Complete 04/12/2024  Allergen Reaction Noted   Benzoyl peroxide Other (See Comments) 12/23/2014   Latex Other (See  Comments) 12/23/2014    Family History  Problem Relation Age of Onset   Cancer Mother        fat cancer   Colonic polyp Mother        78   Hypertension Father    Autism Sister    Colon cancer Maternal Grandmother 62   Celiac disease Neg Hx    Inflammatory bowel disease Neg Hx     Social History   Socioeconomic History   Marital status: Married    Spouse name: Not on file   Number of children: Not on file   Years of education: Not on file   Highest education level: Not on file  Occupational History   Not on file  Tobacco Use   Smoking status: Never   Smokeless tobacco: Never  Vaping Use   Vaping status: Never Used  Substance and Sexual Activity   Alcohol use: Not Currently    Comment: social   Drug use: No   Sexual activity: Yes    Birth control/protection: Pill  Other Topics Concern   Not on file  Social History Narrative   Not on file   Social Drivers of Health   Financial Resource Strain: Not on file  Food Insecurity: Not on file  Transportation Needs: Not on file  Physical Activity: Not on file  Stress: No Stress Concern Present (12/28/2018)   [employer] of Occupational Health - Occupational Stress Questionnaire    Feeling  of Stress : Not at all  Social Connections: Not on file  Intimate Partner Violence: Not At Risk (12/28/2018)   Humiliation, Afraid, Rape, and Kick questionnaire    Fear of Current or Ex-Partner: No    Emotionally Abused: No    Physically Abused: No    Sexually Abused: No    Review of Systems   See HPI, otherwise negative ROS  Physical Exam: BP (!) 124/91   Pulse 86   Temp 98.4 F (36.9 C) (Oral)   Resp 20   Ht 5' 6 (1.676 m)   Wt 102.1 kg   SpO2 98%   BMI 36.32 kg/m  General:   Alert,  Well-developed, well-nourished, pleasant and cooperative in NAD Neck:  Supple; no masses or thyromegaly. No significant cervical adenopathy. Lungs:  Clear throughout to auscultation.   No wheezes, crackles, or rhonchi. No acute  distress. Heart:  Regular rate and rhythm; no murmurs, clicks, rubs,  or gallops. Abdomen: Non-distended, normal bowel sounds.  Soft and nontender without appreciable mass or hepatosplenomegaly.  Impression/Plan:      33 year old lady here for high risk screening colonoscopy given mother's history of polyps found at a young age.  I have offered the patient a high risk screening colonoscopy today.  The risks, benefits, limitations, alternatives and imponderables have been reviewed with the patient. Questions have been answered. All parties are agreeable.     Notice: This dictation was prepared with Dragon dictation along with smaller phrase technology. Any transcriptional errors that result from this process are unintentional and may not be corrected upon review.

## 2024-05-27 NOTE — Anesthesia Preprocedure Evaluation (Signed)
 Anesthesia Evaluation  Patient identified by MRN, date of birth, ID band Patient awake    Reviewed: Allergy & Precautions, H&P , NPO status , Patient's Chart, lab work & pertinent test results, reviewed documented beta blocker date and time   Airway Mallampati: II  TM Distance: >3 FB Neck ROM: full    Dental no notable dental hx.    Pulmonary neg pulmonary ROS   Pulmonary exam normal breath sounds clear to auscultation       Cardiovascular Exercise Tolerance: Good hypertension, negative cardio ROS + Valvular Problems/Murmurs  Rhythm:regular Rate:Normal     Neuro/Psych  PSYCHIATRIC DISORDERS Anxiety Depression    negative neurological ROS  negative psych ROS   GI/Hepatic negative GI ROS, Neg liver ROS,GERD  ,,  Endo/Other  negative endocrine ROS    Renal/GU negative Renal ROS  negative genitourinary   Musculoskeletal   Abdominal   Peds  Hematology negative hematology ROS (+) Blood dyscrasia, anemia   Anesthesia Other Findings   Reproductive/Obstetrics negative OB ROS                              Anesthesia Physical Anesthesia Plan  ASA: 2  Anesthesia Plan: General   Post-op Pain Management:    Induction:   PONV Risk Score and Plan: Propofol  infusion  Airway Management Planned:   Additional Equipment:   Intra-op Plan:   Post-operative Plan:   Informed Consent: I have reviewed the patients History and Physical, chart, labs and discussed the procedure including the risks, benefits and alternatives for the proposed anesthesia with the patient or authorized representative who has indicated his/her understanding and acceptance.     Dental Advisory Given  Plan Discussed with: CRNA  Anesthesia Plan Comments:         Anesthesia Quick Evaluation

## 2024-05-27 NOTE — Transfer of Care (Signed)
 Immediate Anesthesia Transfer of Care Note  Patient: Marissa Blake  Procedure(s) Performed: COLONOSCOPY  Patient Location: PACU  Anesthesia Type:General  Level of Consciousness: awake, alert , and oriented  Airway & Oxygen Therapy: Patient Spontanous Breathing  Post-op Assessment: Report given to RN and Post -op Vital signs reviewed and stable  Post vital signs: Reviewed and stable  Last Vitals:  Vitals Value Taken Time  BP 105/63 05/27/24 14:33  Temp 36.8 C 05/27/24 14:33  Pulse 86 05/27/24 14:33  Resp 16 05/27/24 14:33  SpO2 99 % 05/27/24 14:33    Last Pain:  Vitals:   05/27/24 1433  TempSrc: Oral  PainSc: 0-No pain      Patients Stated Pain Goal: 8 (05/27/24 1306)  Complications: No notable events documented.

## 2024-05-27 NOTE — Discharge Instructions (Addendum)
  Colonoscopy Discharge Instructions  Read the instructions outlined below and refer to this sheet in the next few weeks. These discharge instructions provide you with general information on caring for yourself after you leave the hospital. Your doctor may also give you specific instructions. While your treatment has been planned according to the most current medical practices available, unavoidable complications occasionally occur. If you have any problems or questions after discharge, call Dr. Shaaron at [phone]. ACTIVITY You may resume your regular activity, but move at a slower pace for the next 24 hours.  Take frequent rest periods for the next 24 hours.  Walking will help get rid of the air and reduce the bloated feeling in your belly (abdomen).  No driving for 24 hours (because of the medicine (anesthesia) used during the test).   Do not sign any important legal documents or operate any machinery for 24 hours (because of the anesthesia used during the test).  NUTRITION Drink plenty of fluids.  You may resume your normal diet as instructed by your doctor.  Begin with a light meal and progress to your normal diet. Heavy or fried foods are harder to digest and may make you feel sick to your stomach (nauseated).  Avoid alcoholic beverages for 24 hours or as instructed.  MEDICATIONS You may resume your normal medications unless your doctor tells you otherwise.  WHAT YOU CAN EXPECT TODAY Some feelings of bloating in the abdomen.  Passage of more gas than usual.  Spotting of blood in your stool or on the toilet paper.  IF YOU HAD POLYPS REMOVED DURING THE COLONOSCOPY: No aspirin products for 7 days or as instructed.  No alcohol for 7 days or as instructed.  Eat a soft diet for the next 24 hours.  FINDING OUT THE RESULTS OF YOUR TEST Not all test results are available during your visit. If your test results are not back during the visit, make an appointment with your caregiver to find out the  results. Do not assume everything is normal if you have not heard from your caregiver or the medical facility. It is important for you to follow up on all of your test results.  SEEK IMMEDIATE MEDICAL ATTENTION IF: You have more than a spotting of blood in your stool.  Your belly is swollen (abdominal distention).  You are nauseated or vomiting.  You have a temperature over 101.  You have abdominal pain or discomfort that is severe or gets worse throughout the day.     Your colonoscopy was completely normal today  It is recommended you have a repeat examination in 5 years

## 2024-05-28 ENCOUNTER — Encounter (HOSPITAL_COMMUNITY): Payer: Self-pay | Admitting: Internal Medicine

## 2024-05-31 ENCOUNTER — Telehealth: Payer: Self-pay | Admitting: Gastroenterology

## 2024-05-31 NOTE — Telephone Encounter (Signed)
 This patient has 2 appointments scheduled.  You scheduled one of them just today.  Does she need both appointments?

## 2024-05-31 NOTE — Telephone Encounter (Signed)
 Pt was made aware and verbalized understanding. Appt was made and pt stated that she is doing good now that it is only bad when the pains hit.

## 2024-05-31 NOTE — Telephone Encounter (Signed)
 Duplicate was cancelled. Pt was notified.

## 2024-06-01 LAB — GENECONNECT MOLECULAR SCREEN: Genetic Analysis Overall Interpretation: NEGATIVE

## 2024-06-04 NOTE — Anesthesia Postprocedure Evaluation (Signed)
 Anesthesia Post Note  Patient: Jeoffrey GORMAN Moats  Procedure(s) Performed: COLONOSCOPY  Patient location during evaluation: Phase II Anesthesia Type: General Level of consciousness: awake Pain management: pain level controlled Vital Signs Assessment: post-procedure vital signs reviewed and stable Respiratory status: spontaneous breathing and respiratory function stable Cardiovascular status: blood pressure returned to baseline and stable Postop Assessment: no headache and no apparent nausea or vomiting Anesthetic complications: no Comments: Late entry   No notable events documented.   Last Vitals:  Vitals:   05/27/24 1322 05/27/24 1433  BP: (!) 124/91 105/63  Pulse: 86 86  Resp: 20 16  Temp: 36.9 C 36.8 C  SpO2: 98% 99%    Last Pain:  Vitals:   05/27/24 1433  TempSrc: Oral  PainSc: 0-No pain                 Yvonna JINNY Bosworth

## 2024-06-29 ENCOUNTER — Encounter: Payer: Self-pay | Admitting: Gastroenterology

## 2024-06-29 ENCOUNTER — Ambulatory Visit (INDEPENDENT_AMBULATORY_CARE_PROVIDER_SITE_OTHER): Admitting: Gastroenterology

## 2024-06-29 VITALS — BP 128/86 | HR 73 | Temp 98.1°F | Ht 66.0 in | Wt 224.6 lb

## 2024-06-29 DIAGNOSIS — R1013 Epigastric pain: Secondary | ICD-10-CM

## 2024-06-29 MED ORDER — DICYCLOMINE HCL 20 MG PO TABS: 20.0000 mg | ORAL_TABLET | Freq: Four times a day (QID) | ORAL | 1 refills | Status: DC | PRN

## 2024-06-29 NOTE — Patient Instructions (Signed)
 Please have your labs done. If you have not heard from me with results within 5 business days, please reach out and make sure I received results.  Try dicyclomine 20mg  up to four times daily as needed for abdominal pain and/or stool urgency. Hold for constipation.

## 2024-06-29 NOTE — Progress Notes (Signed)
 GI Office Note    Referring Provider: Blanca Reynolds LELON DEVONNA Primary Care Physician:  Blanca Reynolds LELON, [place]  Primary Gastroenterologist: Ozell Hollingshead, MD   Chief Complaint   Chief Complaint  Patient presents with   Abdominal Pain    States that she has abd pain that has been going on for about 2 yrs. States that the omeprazole  made her abd pain worse.    History of Present Illness   Marissa Blake is a 33 y.o. female presenting today for follow up of abdominal pain, constipation. Last seen 03/2024.   Discussed the use of AI scribe software for clinical note transcription with the patient, who gave verbal consent to proceed.   Since her last office visit, she completed colonoscopy and upper endoscopy as outlined below. Really nothing to explain her abdominal pain.   She experiences persistent abdominal pain described as 'burning'. The pain is inconsistent, occurring daily or with several days without symptoms. It is often exacerbated by eating, drinking water , or stress, but can also occur without any apparent trigger. A particularly stressful event at a fair with her family intensified her symptoms recently.  She has undergone extensive workup including endoscopy and colonoscopy, which did not reveal any significant findings. Her gallbladder has been evaluated multiple times with ultrasounds in 2009, 2016, 2019, and 2023. In 2016, U/S showed ?debris in CBD (normal caliber). She was referred to general surgery. Seen by Dr. Donnice Lunger. She had HIDA which was negative.    She has previously been on omeprazole  and more recently on pantoprazole , but is currently not taking either medication as they did not alleviate her symptoms. She is trying to minimize medication use to better understand her condition. She has also tried sucralfate without noticeable improvement. Denies NSAIDs/ASA.  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 Stress exacerbates her symptoms and she has been attempting to manage stress by reducing activities that trigger anxiety, such as singing in the choir.  She has been on Wellbutrin for three to four years but has been out of the medication for about a month. She reports no significant withdrawal symptoms, though she has noticed increased impulsive buying and binge eating. She is awaiting a refill from her psychiatrist.  In terms of bowel habits, she reports frequent bowel movements, often experiencing urgency and diarrhea, particularly when stressed. Her stools are mostly type 6 on the [facility] Stool Chart, with occasional type 5 and 4. No constipation and sometimes bowel movements relieve her abdominal pain. No melena, brbpr. These GI symptoms seem to be different than her chronic epigastric pain although notes her epigastric pain can also be associated with loose stools at times.     Wt Readings from Last 3 Encounters:  06/29/24 224 lb 9.6 oz (101.9 kg)  05/27/24 225 lb (102.1 kg)  04/12/24 227 lb (103 kg)     Prior Data   02/2024: Na 140, K4, Cre 1.05, Tbili 0.7, AP 85, AST 7, ALT 7, A1C 5.2, TSH 2.344   Abd U/S 03/2022: normal exam. Gallbladder remains in situ.   EGD 04/2024: -patchy gastric erythema, neg h.pylori -gastric polyps, fundic gland polyp  Colonoscopy 05/2024: -entire colon normal -distal TI normal -screen in five years 06/2023: Hpylori stool antigen negative   Medications   Current Outpatient Medications  Medication Sig Dispense Refill   buPROPion (WELLBUTRIN XL) 300 MG 24 hr tablet Take  300 mg by mouth every morning.     dicyclomine (BENTYL) 20 MG tablet Take 1 tablet (20 mg total) by mouth 4 (four) times daily as needed for spasms (abdominal pain, stool urgency). Hold for constipation 120 tablet 1   hydrochlorothiazide  (HYDRODIURIL ) 25 MG tablet Take 25 mg by mouth daily.     No current facility-administered medications for  this visit.    Allergies   Allergies as of 06/29/2024 - Review Complete 06/29/2024  Allergen Reaction Noted   Benzoyl peroxide Other (See Comments) 12/23/2014   Latex Other (See Comments) 12/23/2014     Review of Systems   General: Negative for anorexia, weight loss, fever, chills, fatigue, weakness. ENT: Negative for hoarseness, difficulty swallowing , nasal congestion. CV: Negative for chest pain, angina, palpitations, dyspnea on exertion, peripheral edema.  Respiratory: Negative for dyspnea at rest, dyspnea on exertion, cough, sputum, wheezing.  GI: See history of present illness. GU:  Negative for dysuria, hematuria, urinary incontinence, urinary frequency, nocturnal urination.  Endo: Negative for unusual weight change.     Physical Exam   BP 128/86 (BP Location: Right Arm, Patient Position: Sitting, Cuff Size: Large)   Pulse 73   Temp 98.1 F (36.7 C) (Oral)   Ht 5' 6 (1.676 m)   Wt 224 lb 9.6 oz (101.9 kg)   LMP  (LMP Unknown)   SpO2 99%   BMI 36.25 kg/m    General: Well-nourished, well-developed in no acute distress.  Eyes: No icterus. Mouth: Oropharyngeal mucosa moist and pink   Abdomen: Bowel sounds are normal, nontender, nondistended, no hepatosplenomegaly or masses,  no abdominal bruits or hernia , no rebound or guarding.  Rectal: not performed Extremities: No lower extremity edema. No clubbing or deformities. Neuro: Alert and oriented x 4   Skin: Warm and dry, no jaundice.   Psych: Alert and cooperative, normal mood and affect.  Labs   See above  Imaging Studies   No results found.  Assessment/Plan:    Chronic intermittent epigastric abdominal pain Chronic intermittent epigastric abdominal pain with burning, exacerbated by stress, also by meals. Previous endoscopy and colonoscopy were unremarkable. Gallbladder evaluations over the years including ultrasounds and a HIDA scan, remotely with questionable debris in the bile duct. Would be reasonable to  revisit the gallbladder as potential source. Stress may also be playing a role. - CMET, CBC, lipase, sed rate, CRP. - If liver function tests are abnormal, order MRI/MRCP. - If liver function tests are normal, consider ultrasound to check for gallstones. If ultrasound unremarkable, then complete gb work up with  HIDA scan. - cannot rule out need for possible CT A/P with contrast depending on clinical course and response - trial of dicyclomine   Suspected irritable bowel syndrome Suspected irritable bowel syndrome with symptoms of abdominal pain relieved by BM, frequent loose stools brought on by meals/stress. Recent colonoscopy reassuring. -Sed rate, CRP, TTG IgA, IgA - trial dicyclomine 20mg  up to four times daily as needed    Sonny RAMAN. Ezzard, MHS, PA-C [facility]

## 2024-07-09 ENCOUNTER — Ambulatory Visit: Admitting: Gastroenterology

## 2024-07-24 ENCOUNTER — Telehealth: Payer: Self-pay | Admitting: Gastroenterology

## 2024-07-24 NOTE — Telephone Encounter (Signed)
 07/01/24: TTG IgA 0.5, IgA 360.5, CRP >10H, Na 143, K 3.6, Cre 0.8, alb 3.1 Tbili 0.4, AP 111, AST 0.4, ALT 9, lipase 56, sed rate 18, WBC 12.8H, H 12.3, Plt 462H  Please let patient know her labs showed slightly elevated WBC, platelets, and CRP which could all indication inflammation somewhere. Her liver and pancreas numbers were normal. Celiac screen negative.   As discussed, would offer her abd u/s to further evaluate her abdominal pain.   Have her symptoms gotten any worse since I saw her several weeks ago?

## 2024-07-27 ENCOUNTER — Other Ambulatory Visit: Payer: Self-pay | Admitting: Gastroenterology

## 2024-07-27 NOTE — Telephone Encounter (Signed)
Lmom for return call.  

## 2024-07-28 ENCOUNTER — Encounter: Payer: Self-pay | Admitting: Gastroenterology

## 2024-07-29 NOTE — Telephone Encounter (Signed)
 Pt was made aware and stated that the pain has started to kick back up again but that it is manageable with the use of the dicyclomine . Pt does not want to have u/s done at this time, will call if she decides to in the future.

## 2024-08-02 NOTE — Telephone Encounter (Signed)
 Ok would recommend rechecking CRP in 6 weeks

## 2024-08-03 ENCOUNTER — Other Ambulatory Visit: Payer: Self-pay

## 2024-08-03 DIAGNOSIS — R1013 Epigastric pain: Secondary | ICD-10-CM

## 2024-08-03 NOTE — Telephone Encounter (Signed)
 Lab has been ordered and will be mailed to the pt to have completed in 6 weeks

## 2024-08-26 ENCOUNTER — Other Ambulatory Visit: Payer: Self-pay

## 2024-08-26 DIAGNOSIS — R1013 Epigastric pain: Secondary | ICD-10-CM

## 2024-09-28 ENCOUNTER — Ambulatory Visit: Admitting: Internal Medicine

## 2024-10-25 ENCOUNTER — Ambulatory Visit: Admitting: Internal Medicine
